# Patient Record
Sex: Male | Born: 1965
Health system: Southern US, Community
[De-identification: ages and names within clinical notes are randomized; demographics above are authoritative.]

## PROBLEM LIST (undated history)

## (undated) DIAGNOSIS — Z9889 Other specified postprocedural states: Secondary | ICD-10-CM

## (undated) DIAGNOSIS — I10 Essential (primary) hypertension: Secondary | ICD-10-CM

## (undated) HISTORY — PX: NASAL SINUS SURGERY: SHX719

## (undated) HISTORY — DX: Other specified postprocedural states: Z98.890

---

## 2005-12-12 ENCOUNTER — Other Ambulatory Visit: Payer: Self-pay

## 2005-12-12 ENCOUNTER — Emergency Department: Payer: Self-pay | Admitting: Unknown Physician Specialty

## 2008-01-01 ENCOUNTER — Other Ambulatory Visit: Payer: Self-pay

## 2008-01-01 ENCOUNTER — Emergency Department: Payer: Self-pay | Admitting: Internal Medicine

## 2008-01-04 ENCOUNTER — Ambulatory Visit: Payer: Self-pay | Admitting: Internal Medicine

## 2012-07-19 ENCOUNTER — Ambulatory Visit: Payer: Self-pay | Admitting: Family Medicine

## 2013-10-18 ENCOUNTER — Ambulatory Visit: Payer: Self-pay | Admitting: Family Medicine

## 2014-10-08 LAB — BASIC METABOLIC PANEL
BUN: 11 mg/dL (ref 4–21)
Creatinine: 0.9 mg/dL (ref <=1.3)
Glucose: 105 mg/dL

## 2014-10-08 LAB — LIPID PANEL
Cholesterol: 211 mg/dL — AB (ref 0–200)
HDL: 38 mg/dL (ref 35–70)
LDL CALC: 145 mg/dL
TRIGLYCERIDES: 139 mg/dL (ref 40–160)

## 2014-10-08 LAB — FECAL OCCULT BLOOD, GUAIAC: Fecal Occult Blood: NEGATIVE

## 2014-10-08 LAB — PSA: PSA: 0.4

## 2014-10-26 DIAGNOSIS — Z0000 Encounter for general adult medical examination without abnormal findings: Secondary | ICD-10-CM | POA: Insufficient documentation

## 2014-10-26 DIAGNOSIS — I10 Essential (primary) hypertension: Secondary | ICD-10-CM | POA: Insufficient documentation

## 2014-10-26 DIAGNOSIS — M5440 Lumbago with sciatica, unspecified side: Secondary | ICD-10-CM | POA: Insufficient documentation

## 2014-10-26 DIAGNOSIS — F339 Major depressive disorder, recurrent, unspecified: Secondary | ICD-10-CM | POA: Insufficient documentation

## 2014-10-26 DIAGNOSIS — E66812 Obesity, class 2: Secondary | ICD-10-CM | POA: Insufficient documentation

## 2014-10-26 DIAGNOSIS — IMO0002 Reserved for concepts with insufficient information to code with codable children: Secondary | ICD-10-CM | POA: Insufficient documentation

## 2014-10-26 DIAGNOSIS — J3081 Allergic rhinitis due to animal (cat) (dog) hair and dander: Secondary | ICD-10-CM | POA: Insufficient documentation

## 2015-06-08 ENCOUNTER — Other Ambulatory Visit: Payer: Self-pay | Admitting: Family Medicine

## 2015-12-04 ENCOUNTER — Other Ambulatory Visit: Payer: Self-pay | Admitting: Family Medicine

## 2015-12-16 ENCOUNTER — Other Ambulatory Visit: Payer: Self-pay

## 2015-12-19 ENCOUNTER — Encounter: Payer: Self-pay | Admitting: Family Medicine

## 2015-12-19 ENCOUNTER — Ambulatory Visit (INDEPENDENT_AMBULATORY_CARE_PROVIDER_SITE_OTHER): Payer: 59 | Admitting: Family Medicine

## 2015-12-19 VITALS — BP 120/100 | HR 80 | Resp 12 | Ht 72.0 in | Wt 248.0 lb

## 2015-12-19 DIAGNOSIS — K625 Hemorrhage of anus and rectum: Secondary | ICD-10-CM

## 2015-12-19 DIAGNOSIS — J301 Allergic rhinitis due to pollen: Secondary | ICD-10-CM

## 2015-12-19 DIAGNOSIS — I10 Essential (primary) hypertension: Secondary | ICD-10-CM

## 2015-12-19 DIAGNOSIS — Z13220 Encounter for screening for lipoid disorders: Secondary | ICD-10-CM

## 2015-12-19 LAB — HEMOCCULT GUIAC POC 1CARD (OFFICE): Fecal Occult Blood, POC: POSITIVE — AB

## 2015-12-19 MED ORDER — LORATADINE 10 MG PO TABS: 10.0000 mg | ORAL_TABLET | Freq: Every day | ORAL | Status: DC

## 2015-12-19 MED ORDER — FLUTICASONE PROPIONATE 50 MCG/ACT NA SUSP: 1.0000 | Freq: Every day | NASAL | Status: DC

## 2015-12-19 MED ORDER — LOSARTAN POTASSIUM 100 MG PO TABS: 100.0000 mg | ORAL_TABLET | Freq: Every day | ORAL | Status: DC

## 2015-12-19 NOTE — Addendum Note (Signed)
Addended by: Everitt AmberLYNCH, Nahjae Hoeg L on: 12/19/2015 11:36 AM   Modules accepted: Orders

## 2015-12-19 NOTE — Progress Notes (Signed)
Name: Erik Valdez   MRN:Rudene Re [id number]    DOB: 01/18/1966   Date:12/19/2015       Progress Note  Subjective  Chief Complaint  Chief Complaint  Patient presents with  . Allergic Rhinitis   . Hypertension    Hypertension This is a chronic problem. The current episode started more than 1 year ago. The problem has been waxing and waning since onset. The problem is uncontrolled. Pertinent negatives include no anxiety, blurred vision, chest pain, headaches, malaise/fatigue, neck pain, orthopnea, palpitations, peripheral edema, PND, shortness of breath or sweats. There are no associated agents to hypertension. There are no known risk factors for coronary artery disease. Past treatments include angiotensin blockers. The current treatment provides no improvement. There are no compliance problems (occ med missed).  There is no history of angina, kidney disease, CAD/MI, CVA, heart failure, left ventricular hypertrophy, PVD, renovascular disease or retinopathy. There is no history of chronic renal disease or a hypertension causing med.  GI Problem The primary symptoms include hematochezia. Primary symptoms do not include fever, weight loss, fatigue, abdominal pain, nausea, diarrhea, melena, hematemesis, dysuria, myalgias or rash. The illness began more than 7 days ago. The onset was gradual (BRBPR).  The illness is also significant for itching. The illness does not include chills, constipation or back pain. Associated medical issues do not include inflammatory bowel disease, GERD, gallstones, liver disease, alcohol abuse, PUD, gastric bypass, bowel resection, irritable bowel syndrome, hemorrhoids or diverticulitis.    No problem-specific assessment & plan notes found for this encounter.   No past medical history on file.  Past Surgical History  Procedure Laterality Date  . Nasal sinus surgery      No family history on file.  Social History   Social History  . Marital Status: Married    Spouse  Name: N/A  . Number of Children: N/A  . Years of Education: N/A   Occupational History  . Not on file.   Social History Main Topics  . Smoking status: Former Games developermoker  . Smokeless tobacco: Current User    Types: Chew  . Alcohol Use: 0.0 oz/week    0 Standard drinks or equivalent per week  . Drug Use: No  . Sexual Activity: Not on file   Other Topics Concern  . Not on file   Social History Narrative    No Known Allergies   Review of Systems  Constitutional: Negative for fever, chills, weight loss, malaise/fatigue and fatigue.  HENT: Negative for ear discharge, ear pain and sore throat.   Eyes: Negative for blurred vision.  Respiratory: Negative for cough, sputum production, shortness of breath and wheezing.   Cardiovascular: Negative for chest pain, palpitations, orthopnea, leg swelling and PND.  Gastrointestinal: Positive for hematochezia. Negative for heartburn, nausea, abdominal pain, diarrhea, constipation, blood in stool, melena and hematemesis.  Genitourinary: Negative for dysuria, urgency, frequency and hematuria.  Musculoskeletal: Negative for myalgias, back pain, joint pain and neck pain.  Skin: Positive for itching. Negative for rash.  Neurological: Negative for dizziness, tingling, sensory change, focal weakness and headaches.  Endo/Heme/Allergies: Negative for environmental allergies and polydipsia. Does not bruise/bleed easily.  Psychiatric/Behavioral: Negative for depression and suicidal ideas. The patient is not nervous/anxious and does not have insomnia.      Objective  Filed Vitals:   12/19/15 0913  BP: 120/100  Pulse: 80  Resp: 12  Height: 6' (1.829 m)  Weight: 248 lb (112.492 kg)    Physical Exam  Constitutional: He is oriented  to person, place, and time and well-developed, well-nourished, and in no distress.  HENT:  Head: Normocephalic.  Right Ear: External ear normal.  Left Ear: External ear normal.  Nose: Nose normal.  Mouth/Throat:  Oropharynx is clear and moist.  Eyes: Conjunctivae and EOM are normal. Pupils are equal, round, and reactive to light. Right eye exhibits no discharge. Left eye exhibits no discharge. No scleral icterus.  Neck: Normal range of motion. Neck supple. No JVD present. No tracheal deviation present. No thyromegaly present.  Cardiovascular: Normal rate, regular rhythm, normal heart sounds and intact distal pulses.  Exam reveals no gallop and no friction rub.   No murmur heard. Pulmonary/Chest: Breath sounds normal. No respiratory distress. He has no wheezes. He has no rales.  Abdominal: Soft. Bowel sounds are normal. He exhibits no mass. There is no hepatosplenomegaly. There is no tenderness. There is no rebound, no guarding and no CVA tenderness.  Genitourinary: Prostate normal. Rectal exam shows no external hemorrhoid, no internal hemorrhoid, no fissure, no laceration, no mass and no tenderness. Guaiac positive stool.  Musculoskeletal: Normal range of motion. He exhibits no edema or tenderness.  Lymphadenopathy:    He has no cervical adenopathy.  Neurological: He is alert and oriented to person, place, and time. He has normal sensation, normal strength and intact cranial nerves. No cranial nerve deficit.  Skin: Skin is warm. No rash noted.  Psychiatric: Mood and affect normal.  Nursing note and vitals reviewed.     Assessment & Plan  Problem List Items Addressed This Visit      Cardiovascular and Mediastinum   Essential (primary) hypertension - Primary   Relevant Medications   losartan (COZAAR) 100 MG tablet   Other Relevant Orders   Basic metabolic panel    Other Visit Diagnoses    Allergic rhinitis due to pollen        Relevant Medications    fluticasone (FLONASE) 50 MCG/ACT nasal spray    loratadine (CLARITIN) 10 MG tablet    Bright red rectal bleeding        Relevant Orders    POCT Occult Blood Stool (Completed)    Ambulatory referral to Gastroenterology    Screening cholesterol  level        Relevant Orders    Lipid Profile         Dr. Elizabeth Sauereanna [facility]relle [facility]  12/19/2015

## 2015-12-20 ENCOUNTER — Other Ambulatory Visit: Payer: Self-pay | Admitting: Family Medicine

## 2015-12-20 LAB — BASIC METABOLIC PANEL
BUN / CREAT RATIO: 13 (ref 9–20)
BUN: 11 mg/dL (ref 6–24)
CALCIUM: 9.4 mg/dL (ref 8.7–10.2)
CHLORIDE: 101 mmol/L (ref 96–106)
CO2: 23 mmol/L (ref 18–29)
Creatinine, Ser: 0.88 mg/dL (ref 0.76–1.27)
GFR calc non Af Amer: 102 mL/min/{1.73_m2} (ref >59)
GFR, EST AFRICAN AMERICAN: 117 mL/min/{1.73_m2} (ref >59)
GLUCOSE: 95 mg/dL (ref 65–99)
POTASSIUM: 4.7 mmol/L (ref 3.5–5.2)
Sodium: 141 mmol/L (ref 134–144)

## 2015-12-20 LAB — LIPID PANEL
CHOLESTEROL TOTAL: 178 mg/dL (ref 100–199)
Chol/HDL Ratio: 4.6 ratio units (ref 0.0–5.0)
HDL: 39 mg/dL — AB (ref >39)
LDL Calculated: 106 mg/dL — ABNORMAL HIGH (ref 0–99)
Triglycerides: 163 mg/dL — ABNORMAL HIGH (ref 0–149)
VLDL Cholesterol Cal: 33 mg/dL (ref 5–40)

## 2015-12-20 MED ORDER — HYDROCORTISONE 2.5 % RE CREA: 1.0000 "application " | TOPICAL_CREAM | Freq: Two times a day (BID) | RECTAL | Status: DC

## 2015-12-20 NOTE — Addendum Note (Signed)
Addended by: Everitt AmberLYNCH, Francesco Provencal L on: 12/20/2015 10:43 AM   Modules accepted: Orders

## 2015-12-21 DIAGNOSIS — Z9889 Other specified postprocedural states: Secondary | ICD-10-CM

## 2015-12-21 HISTORY — DX: Other specified postprocedural states: Z98.890

## 2015-12-25 ENCOUNTER — Other Ambulatory Visit: Payer: Self-pay

## 2016-01-07 ENCOUNTER — Telehealth: Payer: Self-pay

## 2016-01-07 ENCOUNTER — Other Ambulatory Visit: Payer: Self-pay

## 2016-01-07 MED ORDER — NA SULFATE-K SULFATE-MG SULF 17.5-3.13-1.6 GM/177ML PO SOLN: 1.0000 | Freq: Once | ORAL | Status: DC

## 2016-01-07 NOTE — Telephone Encounter (Signed)
Gastroenterology Pre-Procedure Review  Request Date: 01/16/2016 Requesting Physician: Dr. Yetta BarreJones  PATIENT REVIEW QUESTIONS: The patient responded to the following health history questions as indicated:    1. Are you having any GI issues? no 2. Do you have a personal history of Polyps? no 3. Do you have a family history of Colon Cancer or Polyps? no 4. Diabetes Mellitus? no 5. Joint replacements in the past 12 months?no 6. Major health problems in the past 3 months?no 7. Any artificial heart valves, MVP, or defibrillator?no    MEDICATIONS & ALLERGIES:    Patient reports the following regarding taking any anticoagulation/antiplatelet therapy:   Plavix, Coumadin, Eliquis, Xarelto, Lovenox, Pradaxa, Brilinta, or Effient? no Aspirin? no  Patient confirms/reports the following medications:  Current Outpatient Prescriptions  Medication Sig Dispense Refill  . fluticasone (FLONASE) 50 MCG/ACT nasal spray Place 1 spray into both nostrils daily. 16 g 11  . loratadine (CLARITIN) 10 MG tablet Take 1 tablet (10 mg total) by mouth daily. 30 tablet 11  . losartan (COZAAR) 100 MG tablet Take 1 tablet (100 mg total) by mouth daily. 10 tablet 0  . hydrocortisone (ANUSOL-HC) 2.5 % rectal cream Place 1 application rectally 2 (two) times daily. (Patient not taking: Reported on 01/07/2016) 30 g 0   No current facility-administered medications for this visit.    Patient confirms/reports the following allergies:  No Known Allergies  No orders of the defined types were placed in this encounter.    AUTHORIZATION INFORMATION Primary Insurance: 1D#: Group #:  Secondary Insurance: 1D#: Group #:  SCHEDULE INFORMATION: Date: 01/16/2016 Time: Location: [facility]

## 2016-01-10 ENCOUNTER — Encounter: Payer: Self-pay | Admitting: *Deleted

## 2016-01-13 NOTE — Discharge Instructions (Signed)

## 2016-01-16 ENCOUNTER — Ambulatory Visit: Payer: 59 | Admitting: Anesthesiology

## 2016-01-16 ENCOUNTER — Ambulatory Visit
Admission: RE | Admit: 2016-01-16 | Discharge: 2016-01-16 | Disposition: A | Discharge status: Home / Self Care | Payer: 59 | Source: Ambulatory Visit | Attending: Gastroenterology | Admitting: Gastroenterology

## 2016-01-16 ENCOUNTER — Encounter
Admission: RE | Disposition: A | Discharge status: Home / Self Care | Payer: Self-pay | Source: Ambulatory Visit | Attending: Gastroenterology

## 2016-01-16 DIAGNOSIS — K641 Second degree hemorrhoids: Secondary | ICD-10-CM | POA: Diagnosis not present

## 2016-01-16 DIAGNOSIS — I10 Essential (primary) hypertension: Secondary | ICD-10-CM | POA: Insufficient documentation

## 2016-01-16 DIAGNOSIS — Z9889 Other specified postprocedural states: Secondary | ICD-10-CM | POA: Insufficient documentation

## 2016-01-16 DIAGNOSIS — K921 Melena: Secondary | ICD-10-CM

## 2016-01-16 DIAGNOSIS — Z79899 Other long term (current) drug therapy: Secondary | ICD-10-CM | POA: Diagnosis not present

## 2016-01-16 DIAGNOSIS — Z7951 Long term (current) use of inhaled steroids: Secondary | ICD-10-CM | POA: Insufficient documentation

## 2016-01-16 DIAGNOSIS — F17220 Nicotine dependence, chewing tobacco, uncomplicated: Secondary | ICD-10-CM | POA: Insufficient documentation

## 2016-01-16 HISTORY — PX: COLONOSCOPY WITH PROPOFOL: SHX5780

## 2016-01-16 HISTORY — DX: Essential (primary) hypertension: I10

## 2016-01-16 SURGERY — COLONOSCOPY WITH PROPOFOL
Anesthesia: Monitor Anesthesia Care | Wound class: Contaminated

## 2016-01-16 MED ORDER — LACTATED RINGERS IV SOLN
INTRAVENOUS | Status: DC
  Administered 2016-01-16: 08:00:00 via INTRAVENOUS

## 2016-01-16 MED ORDER — LACTATED RINGERS IV SOLN: 500.0000 mL | INTRAVENOUS | Status: DC

## 2016-01-16 MED ORDER — PROPOFOL 10 MG/ML IV BOLUS
INTRAVENOUS | Status: DC | PRN
  Administered 2016-01-16: 50 mg via INTRAVENOUS
  Administered 2016-01-16: 150 mg via INTRAVENOUS
  Administered 2016-01-16: 30 mg via INTRAVENOUS
  Administered 2016-01-16: 50 mg via INTRAVENOUS

## 2016-01-16 MED ORDER — LIDOCAINE HCL (CARDIAC) 20 MG/ML IV SOLN
INTRAVENOUS | Status: DC | PRN
  Administered 2016-01-16: 50 mg via INTRAVENOUS

## 2016-01-16 MED ORDER — STERILE WATER FOR IRRIGATION IR SOLN
Status: DC | PRN
  Administered 2016-01-16: 10:00:00

## 2016-01-16 SURGICAL SUPPLY — 23 items
CANISTER SUCT 1200ML W/VALVE (MISCELLANEOUS) ×3 IMPLANT
CLIP HMST 235XBRD CATH ROT (MISCELLANEOUS) IMPLANT
CLIP RESOLUTION 360 11X235 (MISCELLANEOUS)
FCP ESCP3.2XJMB 240X2.8X (MISCELLANEOUS)
FORCEPS BIOP RAD 4 LRG CAP 4 (CUTTING FORCEPS) IMPLANT
FORCEPS BIOP RJ4 240 W/NDL (MISCELLANEOUS)
FORCEPS ESCP3.2XJMB 240X2.8X (MISCELLANEOUS) IMPLANT
GOWN CVR UNV OPN BCK APRN NK (MISCELLANEOUS) ×2 IMPLANT
GOWN ISOL THUMB LOOP REG UNIV (MISCELLANEOUS) ×4
INJECTOR VARIJECT VIN23 (MISCELLANEOUS) IMPLANT
KIT DEFENDO VALVE AND CONN (KITS) IMPLANT
KIT ENDO PROCEDURE OLY (KITS) ×3 IMPLANT
MARKER SPOT ENDO TATTOO 5ML (MISCELLANEOUS) IMPLANT
PAD GROUND ADULT SPLIT (MISCELLANEOUS) IMPLANT
PROBE APC STR FIRE (PROBE) IMPLANT
RETRIEVER NET ROTH 2.5X230 LF (MISCELLANEOUS) ×3 IMPLANT
SNARE SHORT THROW 13M SML OVAL (MISCELLANEOUS) IMPLANT
SNARE SHORT THROW 30M LRG OVAL (MISCELLANEOUS) IMPLANT
SNARE SNG USE RND 15MM (INSTRUMENTS) IMPLANT
SPOT EX ENDOSCOPIC TATTOO (MISCELLANEOUS)
TRAP ETRAP POLY (MISCELLANEOUS) IMPLANT
VARIJECT INJECTOR VIN23 (MISCELLANEOUS)
WATER STERILE IRR 250ML POUR (IV SOLUTION) ×3 IMPLANT

## 2016-01-16 NOTE — Op Note (Signed)
[facility] Gastroenterology Patient Name: Erik Valdez Procedure Date: 01/16/2016 9:38 AM MRN: [id number] Account #: [id number] Date of Birth: [birth year] Admit Type: Outpatient Age: 50 Room: [facility] OR ROOM 01 Gender: Male Note Status: Finalized Procedure:            Colonoscopy Indications:          Hematochezia Providers:            Midge Minium MD, MD Referring MD:         Duanne Limerick, MD (Referring MD) Medicines:            Propofol per Anesthesia Complications:        No immediate complications. Procedure:            Pre-Anesthesia Assessment:                       - Prior to the procedure, a History and Physical was                        performed, and patient medications and allergies were                        reviewed. The patient's tolerance of previous                        anesthesia was also reviewed. The risks and benefits of                        the procedure and the sedation options and risks were                        discussed with the patient. All questions were                        answered, and informed consent was obtained. Prior                        Anticoagulants: The patient has taken no previous                        anticoagulant or antiplatelet agents. ASA Grade                        Assessment: II - A patient with mild systemic disease.                        After reviewing the risks and benefits, the patient was                        deemed in satisfactory condition to undergo the                        procedure.                       After obtaining informed consent, the colonoscope was                        passed under direct vision. Throughout the procedure,  the patient's blood pressure, pulse, and oxygen                        saturations were monitored continuously. The Olympus CF                        H180AL colonoscope (S#: [id number]) was introduced through                        the  anus and advanced to the the cecum, identified by                        appendiceal orifice and ileocecal valve. The                        colonoscopy was performed without difficulty. The                        patient tolerated the procedure well. The quality of                        the bowel preparation was excellent. Findings:      The perianal and digital rectal examinations were normal.      Non-bleeding internal hemorrhoids were found during retroflexion. The       hemorrhoids were Grade II (internal hemorrhoids that prolapse but reduce       spontaneously). Impression:           - Non-bleeding internal hemorrhoids.                       - No specimens collected. Recommendation:       - High fiber diet. Procedure Code(s):    --- Professional ---                       [phone], Colonoscopy, flexible; diagnostic, including                        collection of specimen(s) by brushing or washing, when                        performed (separate procedure) Diagnosis Code(s):    --- Professional ---                       K92.1, Melena (includes Hematochezia) CPT copyright 2016 American Medical Association. All rights reserved. The codes documented in this report are preliminary and upon coder review may  be revised to meet current compliance requirements. Midge Minium MD, MD 01/16/2016 9:54:14 AM This report has been signed electronically. Number of Addenda: 0 Note Initiated On: 01/16/2016 9:38 AM Scope Withdrawal Time: 0 hours 7 minutes 21 seconds  Total Procedure Duration: 0 hours 8 minutes 58 seconds       [facility]

## 2016-01-16 NOTE — Anesthesia Procedure Notes (Signed)
Procedure Name: MAC Date/Time: 01/16/2016 9:39 AM Performed by: Maree Krabbe Pre-anesthesia Checklist: Patient identified, Emergency Drugs available, Suction available, Timeout performed and Patient being monitored Patient Re-evaluated:Patient Re-evaluated prior to inductionOxygen Delivery Method: Nasal cannula Placement Confirmation: positive ETCO2

## 2016-01-16 NOTE — Anesthesia Postprocedure Evaluation (Signed)
Anesthesia Post Note  Patient: Erik Valdez  Procedure(s) Performed: Procedure(s) (LRB): COLONOSCOPY WITH PROPOFOL (N/A)  Patient location during evaluation: PACU Anesthesia Type: MAC Level of consciousness: awake and alert Pain management: pain level controlled Vital Signs Assessment: post-procedure vital signs reviewed and stable Respiratory status: spontaneous breathing, nonlabored ventilation and respiratory function stable Cardiovascular status: stable and blood pressure returned to baseline Anesthetic complications: no    DANIEL D KOVACS

## 2016-01-16 NOTE — Transfer of Care (Signed)
Immediate Anesthesia Transfer of Care Note  Patient: Erik Valdez  Procedure(s) Performed: Procedure(s): COLONOSCOPY WITH PROPOFOL (N/A)  Patient Location: PACU  Anesthesia Type: MAC  Level of Consciousness: awake, alert  and patient cooperative  Airway and Oxygen Therapy: Patient Spontanous Breathing and Patient connected to supplemental oxygen  Post-op Assessment: Post-op Vital signs reviewed, Patient's Cardiovascular Status Stable, Respiratory Function Stable, Patent Airway and No signs of Nausea or vomiting  Post-op Vital Signs: Reviewed and stable  Complications: No apparent anesthesia complications

## 2016-01-16 NOTE — Anesthesia Preprocedure Evaluation (Signed)
Anesthesia Evaluation  Patient identified by MRN, date of birth, ID band Patient awake    Reviewed: Allergy & Precautions, H&P , NPO status , Patient's Chart, lab work & pertinent test results, reviewed documented beta blocker date and time   Airway Mallampati: III  TM Distance: >3 FB Neck ROM: full    Dental no notable dental hx.    Pulmonary neg pulmonary ROS,    Pulmonary exam normal breath sounds clear to auscultation       Cardiovascular Exercise Tolerance: Good hypertension, On Medications  Rhythm:regular Rate:Normal     Neuro/Psych negative neurological ROS  negative psych ROS   GI/Hepatic negative GI ROS, Neg liver ROS,   Endo/Other  negative endocrine ROS  Renal/GU negative Renal ROS  negative genitourinary   Musculoskeletal   Abdominal   Peds  Hematology negative hematology ROS (+)   Anesthesia Other Findings   Reproductive/Obstetrics negative OB ROS                             Anesthesia Physical Anesthesia Plan  ASA: II  Anesthesia Plan: MAC   Post-op Pain Management:    Induction:   Airway Management Planned:   Additional Equipment:   Intra-op Plan:   Post-operative Plan:   Informed Consent: I have reviewed the patients History and Physical, chart, labs and discussed the procedure including the risks, benefits and alternatives for the proposed anesthesia with the patient or authorized representative who has indicated his/her understanding and acceptance.     Plan Discussed with: CRNA  Anesthesia Plan Comments:         Anesthesia Quick Evaluation

## 2016-01-16 NOTE — H&P (Signed)
  Lucilla Lame, MD [address] [place], [place] [address] Phone: [phone] Fax : [phone]  Primary Care Physician:  Otilio Miu, MD Primary Gastroenterologist:  Dr. Allen Norris  Pre-Procedure History & Physical: HPI:  Erik Valdez is a 50 y.o. male is here for an colonoscopy.   Past Medical History:  Diagnosis Date  . Hypertension     Past Surgical History:  Procedure Laterality Date  . NASAL SINUS SURGERY     age 22 or 8    Prior to Admission medications   Medication Sig Start Date End Date Taking? Authorizing Provider  fluticasone (FLONASE) 50 MCG/ACT nasal spray Place 1 spray into both nostrils daily. 12/19/15  Yes Juline Patch, MD  loratadine (CLARITIN) 10 MG tablet Take 1 tablet (10 mg total) by mouth daily. 12/19/15  Yes Juline Patch, MD  losartan (COZAAR) 100 MG tablet Take 1 tablet (100 mg total) by mouth daily. 12/19/15  Yes Juline Patch, MD  Na Sulfate-K Sulfate-Mg Sulf (SUPREP BOWEL PREP KIT) 17.5-3.13-1.6 GM/180ML SOLN Take 1 Dose by mouth once. 01/07/16  Yes Lucilla Lame, MD  hydrocortisone (ANUSOL-HC) 2.5 % rectal cream Place 1 application rectally 2 (two) times daily. Patient not taking: Reported on 01/07/2016 12/20/15   Juline Patch, MD    Allergies as of 01/07/2016  . (No Known Allergies)    History reviewed. No pertinent family history.  Social History   Social History  . Marital status: Married    Spouse name: N/A  . Number of children: N/A  . Years of education: N/A   Occupational History  . Not on file.   Social History Main Topics  . Smoking status: Never Smoker  . Smokeless tobacco: Current User    Types: Chew     Comment: Smokeless tobacco - occasionally (golfing, etc)  . Alcohol use 3.6 oz/week    6 Cans of beer per week  . Drug use: No  . Sexual activity: Not on file   Other Topics Concern  . Not on file   Social History Narrative  . No narrative on file    Review of Systems: See HPI, otherwise negative  ROS  Physical Exam: BP 124/78   Pulse (!) 57   Temp 98 F (36.7 C) (Temporal)   Resp 16   Ht 6' (1.829 m)   Wt 238 lb (108 kg)   SpO2 97%   BMI 32.28 kg/m  General:   Alert,  pleasant and cooperative in NAD Head:  Normocephalic and atraumatic. Neck:  Supple; no masses or thyromegaly. Lungs:  Clear throughout to auscultation.    Heart:  Regular rate and rhythm. Abdomen:  Soft, nontender and nondistended. Normal bowel sounds, without guarding, and without rebound.   Neurologic:  Alert and  oriented x4;  grossly normal neurologically.  Impression/Plan: Erik Valdez is here for an colonoscopy to be performed for hematochezia  Risks, benefits, limitations, and alternatives regarding  colonoscopy have been reviewed with the patient.  Questions have been answered.  All parties agreeable.   Lucilla Lame, MD  01/16/2016, 8:20 AM

## 2016-01-17 ENCOUNTER — Encounter: Payer: Self-pay | Admitting: Gastroenterology

## 2016-02-03 ENCOUNTER — Other Ambulatory Visit: Payer: Self-pay

## 2016-03-30 ENCOUNTER — Ambulatory Visit (INDEPENDENT_AMBULATORY_CARE_PROVIDER_SITE_OTHER): Payer: 59 | Admitting: Family Medicine

## 2016-03-30 ENCOUNTER — Encounter: Payer: Self-pay | Admitting: Family Medicine

## 2016-03-30 VITALS — BP 120/92 | HR 84 | Ht 72.0 in | Wt 250.0 lb

## 2016-03-30 DIAGNOSIS — I10 Essential (primary) hypertension: Secondary | ICD-10-CM

## 2016-03-30 MED ORDER — LOSARTAN POTASSIUM 100 MG PO TABS: 100.0000 mg | ORAL_TABLET | Freq: Every day | ORAL | 1 refills | Status: DC

## 2016-03-30 MED ORDER — HYDROCHLOROTHIAZIDE 12.5 MG PO TABS
12.5000 mg | ORAL_TABLET | Freq: Every day | ORAL | 1 refills | Status: DC
Start: 2016-03-30 — End: 2016-09-28

## 2016-03-30 NOTE — Progress Notes (Signed)
Name: Erik Valdez   MRN: [id number]    DOB: 1965/07/16   Date:03/30/2016       Progress Note  Subjective  Chief Complaint  Chief Complaint  Patient presents with  . Hypertension    recheck B/P- seems like "it's not working, feel hot all the time"    Hypertension  This is a chronic problem. The current episode started more than 1 year ago. The problem has been waxing and waning since onset. The problem is uncontrolled. Pertinent negatives include no anxiety, blurred vision, chest pain, headaches, malaise/fatigue, neck pain, orthopnea, palpitations, peripheral edema, PND, shortness of breath or sweats. ("feeling hot") There are no associated agents to hypertension. Risk factors for coronary artery disease include dyslipidemia. Past treatments include ACE inhibitors. The current treatment provides mild improvement. There is no history of angina, kidney disease, CAD/MI, CVA, heart failure, left ventricular hypertrophy, PVD, renovascular disease or retinopathy. There is no history of chronic renal disease or a hypertension causing med.    No problem-specific Assessment & Plan notes found for this encounter.   Past Medical History:  Diagnosis Date  . History of colonoscopy 12/21/2015   internal hemorrhoids- cleared for 10 years- Dr Servando Snare  . Hypertension     Past Surgical History:  Procedure Laterality Date  . COLONOSCOPY WITH PROPOFOL N/A 01/16/2016   Procedure: COLONOSCOPY WITH PROPOFOL;  Surgeon: Midge Minium, MD;  Location[facility];  Service: Endoscopy;  Laterality: N/A;  . NASAL SINUS SURGERY     age 50 or 50    No family history on file.  Social History   Social History  . Marital status: Married    Spouse name: N/A  . Number of children: N/A  . Years of education: N/A   Occupational History  . Not on file.   Social History Main Topics  . Smoking status: Never Smoker  . Smokeless tobacco: Current User    Types: Chew     Comment: Smokeless tobacco -  occasionally (golfing, etc)  . Alcohol use 3.6 oz/week    6 Cans of beer per week  . Drug use: No  . Sexual activity: Not on file   Other Topics Concern  . Not on file   Social History Narrative  . No narrative on file    No Known Allergies   Review of Systems  Constitutional: Negative for chills, fever, malaise/fatigue and weight loss.  HENT: Negative for ear discharge, ear pain and sore throat.   Eyes: Negative for blurred vision.  Respiratory: Negative for cough, sputum production, shortness of breath and wheezing.   Cardiovascular: Negative for chest pain, palpitations, orthopnea, leg swelling and PND.  Gastrointestinal: Negative for abdominal pain, blood in stool, constipation, diarrhea, heartburn, melena and nausea.  Genitourinary: Negative for dysuria, frequency, hematuria and urgency.  Musculoskeletal: Negative for back pain, joint pain, myalgias and neck pain.  Skin: Negative for rash.  Neurological: Negative for dizziness, tingling, sensory change, focal weakness and headaches.  Endo/Heme/Allergies: Negative for environmental allergies and polydipsia. Does not bruise/bleed easily.  Psychiatric/Behavioral: Negative for depression and suicidal ideas. The patient is not nervous/anxious and does not have insomnia.      Objective  Vitals:   03/30/16 0922  BP: (!) 120/92  Pulse: 84  Weight: 250 lb (113.4 kg)  Height: 6' (1.829 m)    Physical Exam  Constitutional: He is oriented to person, place, and time and well-developed, well-nourished, and in no distress.  HENT:  Head: Normocephalic.  Right Ear:  External ear normal.  Left Ear: External ear normal.  Nose: Nose normal.  Mouth/Throat: Oropharynx is clear and moist.  Eyes: Conjunctivae and EOM are normal. Pupils are equal, round, and reactive to light. Right eye exhibits no discharge. Left eye exhibits no discharge. No scleral icterus.  Neck: Normal range of motion. Neck supple. No JVD present. No tracheal  deviation present. No thyromegaly present.  Cardiovascular: Normal rate, regular rhythm, normal heart sounds and intact distal pulses.  Exam reveals no gallop and no friction rub.   No murmur heard. Pulmonary/Chest: Breath sounds normal. No respiratory distress. He has no wheezes. He has no rales.  Abdominal: Soft. Bowel sounds are normal. He exhibits no mass. There is no hepatosplenomegaly. There is no tenderness. There is no rebound, no guarding and no CVA tenderness.  Musculoskeletal: Normal range of motion. He exhibits no edema or tenderness.  Lymphadenopathy:    He has no cervical adenopathy.  Neurological: He is alert and oriented to person, place, and time. He has normal sensation, normal strength, normal reflexes and intact cranial nerves. No cranial nerve deficit.  Skin: Skin is warm. No rash noted.  Psychiatric: Mood and affect normal.  Nursing note and vitals reviewed.     Assessment & Plan  Problem List Items Addressed This Visit      Cardiovascular and Mediastinum   Essential (primary) hypertension - Primary   Relevant Medications   hydrochlorothiazide (HYDRODIURIL) 12.5 MG tablet   losartan (COZAAR) 100 MG tablet    Other Visit Diagnoses   None.       Dr. Hayden Rasmusseneanna Teri [facility]  03/30/16

## 2016-05-23 ENCOUNTER — Other Ambulatory Visit: Payer: Self-pay | Admitting: Family Medicine

## 2016-05-23 DIAGNOSIS — I10 Essential (primary) hypertension: Secondary | ICD-10-CM

## 2016-06-19 ENCOUNTER — Ambulatory Visit: Payer: 59 | Admitting: Family Medicine

## 2016-06-25 ENCOUNTER — Ambulatory Visit: Payer: 59 | Admitting: Family Medicine

## 2016-07-10 ENCOUNTER — Other Ambulatory Visit: Payer: Self-pay

## 2016-09-16 ENCOUNTER — Other Ambulatory Visit: Payer: Self-pay | Admitting: Family Medicine

## 2016-09-16 DIAGNOSIS — I10 Essential (primary) hypertension: Secondary | ICD-10-CM

## 2016-09-28 ENCOUNTER — Ambulatory Visit (INDEPENDENT_AMBULATORY_CARE_PROVIDER_SITE_OTHER): Payer: 59 | Admitting: Family Medicine

## 2016-09-28 ENCOUNTER — Encounter: Payer: Self-pay | Admitting: Family Medicine

## 2016-09-28 VITALS — BP 120/88 | HR 86 | Ht 72.0 in | Wt 255.0 lb

## 2016-09-28 DIAGNOSIS — R079 Chest pain, unspecified: Secondary | ICD-10-CM | POA: Diagnosis not present

## 2016-09-28 DIAGNOSIS — E782 Mixed hyperlipidemia: Secondary | ICD-10-CM

## 2016-09-28 DIAGNOSIS — J301 Allergic rhinitis due to pollen: Secondary | ICD-10-CM | POA: Insufficient documentation

## 2016-09-28 DIAGNOSIS — K644 Residual hemorrhoidal skin tags: Secondary | ICD-10-CM | POA: Diagnosis not present

## 2016-09-28 DIAGNOSIS — I10 Essential (primary) hypertension: Secondary | ICD-10-CM | POA: Diagnosis not present

## 2016-09-28 LAB — POCT URINALYSIS DIPSTICK
BILIRUBIN UA: NEGATIVE
GLUCOSE UA: NEGATIVE
KETONES UA: NEGATIVE
Leukocytes, UA: NEGATIVE
NITRITE UA: NEGATIVE
Protein, UA: NEGATIVE
Spec Grav, UA: 1.01 (ref 1.030–1.035)
Urobilinogen, UA: 0.2 (ref ?–2.0)
pH, UA: 5 (ref 5.0–8.0)

## 2016-09-28 MED ORDER — LOSARTAN POTASSIUM 100 MG PO TABS: 100.0000 mg | ORAL_TABLET | Freq: Every day | ORAL | 2 refills | Status: DC

## 2016-09-28 MED ORDER — LORATADINE 10 MG PO TABS: 10.0000 mg | ORAL_TABLET | Freq: Every day | ORAL | 11 refills | Status: DC

## 2016-09-28 MED ORDER — HYDROCORTISONE 2.5 % RE CREA: 1.0000 "application " | TOPICAL_CREAM | Freq: Two times a day (BID) | RECTAL | 3 refills | Status: DC

## 2016-09-28 MED ORDER — FLUTICASONE PROPIONATE 50 MCG/ACT NA SUSP: 1.0000 | Freq: Every day | NASAL | 11 refills | Status: DC

## 2016-09-28 NOTE — Progress Notes (Signed)
Name: Erik Valdez   MRN: [id number]    DOB: 1965/07/27   Date:09/28/2016       Progress Note  Subjective  Chief Complaint  Chief Complaint  Patient presents with  . Annual Exam    Patient present for first onset chest pain and hypertension check.   Chest Pain   This is a new problem. The current episode started more than 1 month ago. The onset quality is gradual. The problem occurs intermittently. The problem has been waxing and waning. The pain is present in the substernal region. The pain is at a severity of 5/10. The pain is moderate. The quality of the pain is described as heavy and tightness (duration 15-20 minutes). The pain radiates to the right jaw. Associated symptoms include exertional chest pressure, headaches, leg pain and shortness of breath. Pertinent negatives include no abdominal pain, back pain, cough, diaphoresis, dizziness, fever, irregular heartbeat, malaise/fatigue, nausea, near-syncope, numbness, orthopnea, palpitations, PND or sputum production. He has tried nothing for the symptoms. Risk factors include male gender.  His past medical history is significant for hypertension.  Pertinent negatives for past medical history include no CHF, no diabetes, no DVT, no hyperlipidemia, no MI, no PE, no PVD, no strokes and no TIA.  His family medical history is significant for aortic dissection and hypertension.  Pertinent negatives for family medical history include: no CAD, no heart disease, no hyperlipidemia, no stroke and no TIA. Prior workup: 10 years ago.  Hypertension  This is a chronic problem. The current episode started more than 1 year ago. The problem has been gradually improving since onset. The problem is controlled. Associated symptoms include chest pain, headaches and shortness of breath. Pertinent negatives include no blurred vision, malaise/fatigue, neck pain, orthopnea, palpitations, peripheral edema or PND. There are no associated agents to hypertension. Risk  factors for coronary artery disease include male gender. Past treatments include ACE inhibitors. There are no compliance problems.  There is no history of angina, kidney disease, CAD/MI, CVA, heart failure, left ventricular hypertrophy, PVD or retinopathy.    No problem-specific Assessment & Plan notes found for this encounter.   Past Medical History:  Diagnosis Date  . History of colonoscopy 12/21/2015   internal hemorrhoids- cleared for 10 years- Dr Servando Snare  . Hypertension     Past Surgical History:  Procedure Laterality Date  . COLONOSCOPY WITH PROPOFOL N/A 01/16/2016   Procedure: COLONOSCOPY WITH PROPOFOL;  Surgeon: Midge Minium, MD;  Location: [facility];  Service: Endoscopy;  Laterality: N/A;  . NASAL SINUS SURGERY     age 20 or 8    History reviewed. No pertinent family history.  Social History   Social History  . Marital status: Married    Spouse name: N/A  . Number of children: N/A  . Years of education: N/A   Occupational History  . Not on file.   Social History Main Topics  . Smoking status: Never Smoker  . Smokeless tobacco: Current User    Types: Chew     Comment: Smokeless tobacco - occasionally (golfing, etc)  . Alcohol use 3.6 oz/week    6 Cans of beer per week  . Drug use: No  . Sexual activity: Not on file   Other Topics Concern  . Not on file   Social History Narrative  . No narrative on file    No Known Allergies  Outpatient Medications Prior to Visit  Medication Sig Dispense Refill  . fluticasone (FLONASE) 50 MCG/ACT nasal spray  Place 1 spray into both nostrils daily. 16 g 11  . hydrocortisone (ANUSOL-HC) 2.5 % rectal cream Place 1 application rectally 2 (two) times daily. 30 g 0  . loratadine (CLARITIN) 10 MG tablet Take 1 tablet (10 mg total) by mouth daily. 30 tablet 11  . losartan (COZAAR) 100 MG tablet TAKE 1 TABLET BY MOUTH  DAILY 90 tablet 0  . hydrochlorothiazide (HYDRODIURIL) 12.5 MG tablet Take 1 tablet (12.5 mg total) by  mouth daily. (Patient not taking: Reported on 09/28/2016) 90 tablet 1   No facility-administered medications prior to visit.     Review of Systems  Constitutional: Negative for chills, diaphoresis, fever, malaise/fatigue and weight loss.  HENT: Negative for ear discharge, ear pain and sore throat.   Eyes: Negative for blurred vision.  Respiratory: Positive for shortness of breath. Negative for cough, sputum production and wheezing.   Cardiovascular: Positive for chest pain. Negative for palpitations, orthopnea, leg swelling, PND and near-syncope.  Gastrointestinal: Negative for abdominal pain, blood in stool, constipation, diarrhea, heartburn, melena and nausea.  Genitourinary: Negative for dysuria, frequency, hematuria and urgency.  Musculoskeletal: Negative for back pain, joint pain, myalgias and neck pain.  Skin: Negative for rash.  Neurological: Positive for headaches. Negative for dizziness, tingling, sensory change, focal weakness and numbness.  Endo/Heme/Allergies: Negative for environmental allergies and polydipsia. Does not bruise/bleed easily.  Psychiatric/Behavioral: Negative for depression and suicidal ideas. The patient is not nervous/anxious and does not have insomnia.      Objective  Vitals:   09/28/16 0827  BP: 120/88  Pulse: 86  SpO2: 97%  Weight: 255 lb (115.7 kg)  Height: 6' (1.829 m)    Physical Exam  Constitutional: He is oriented to person, place, and time and well-developed, well-nourished, and in no distress.  HENT:  Head: Normocephalic.  Right Ear: External ear normal.  Left Ear: External ear normal.  Nose: Nose normal.  Mouth/Throat: Oropharynx is clear and moist.  Eyes: Conjunctivae and EOM are normal. Pupils are equal, round, and reactive to light. Right eye exhibits no discharge. Left eye exhibits no discharge. No scleral icterus.  Neck: Normal range of motion. Neck supple. No JVD present. No tracheal deviation present. No thyromegaly present.   Cardiovascular: Normal rate, regular rhythm, normal heart sounds and intact distal pulses.  Exam reveals no gallop and no friction rub.   No murmur heard. Pulmonary/Chest: Breath sounds normal. No respiratory distress. He has no wheezes. He has no rales.  Abdominal: Soft. Bowel sounds are normal. He exhibits no mass. There is no hepatosplenomegaly. There is no tenderness. There is no rebound, no guarding and no CVA tenderness.  Musculoskeletal: Normal range of motion. He exhibits no edema or tenderness.  Lymphadenopathy:    He has no cervical adenopathy.  Neurological: He is alert and oriented to person, place, and time. He has normal sensation, normal strength and intact cranial nerves. No cranial nerve deficit.  Skin: Skin is warm. No rash noted.  Psychiatric: Mood and affect normal.  Nursing note and vitals reviewed.     Assessment & Plan  Problem List Items Addressed This Visit      Cardiovascular and Mediastinum   Essential hypertension   Relevant Medications   losartan (COZAAR) 100 MG tablet   Other Relevant Orders   POCT Urinalysis Dipstick (Completed)   Renal Function Panel   External hemorrhoid   Relevant Medications   losartan (COZAAR) 100 MG tablet   hydrocortisone (ANUSOL-HC) 2.5 % rectal cream     Respiratory  Chronic seasonal allergic rhinitis due to pollen   Relevant Medications   fluticasone (FLONASE) 50 MCG/ACT nasal spray   loratadine (CLARITIN) 10 MG tablet     Other   Mixed hyperlipidemia   Relevant Medications   losartan (COZAAR) 100 MG tablet   Other Relevant Orders   Lipid Profile    Other Visit Diagnoses    Chest pain, unspecified type    -  Primary   Relevant Medications   losartan (COZAAR) 100 MG tablet   Other Relevant Orders   EKG 12-Lead (Completed)   Ambulatory referral to Cardiology      Meds ordered this encounter  Medications  . losartan (COZAAR) 100 MG tablet    Sig: Take 1 tablet (100 mg total) by mouth daily.     Dispense:  90 tablet    Refill:  2  . hydrocortisone (ANUSOL-HC) 2.5 % rectal cream    Sig: Place 1 application rectally 2 (two) times daily.    Dispense:  30 g    Refill:  3  . fluticasone (FLONASE) 50 MCG/ACT nasal spray    Sig: Place 1 spray into both nostrils daily.    Dispense:  16 g    Refill:  11  . loratadine (CLARITIN) 10 MG tablet    Sig: Take 1 tablet (10 mg total) by mouth daily.    Dispense:  30 tablet    Refill:  11      Dr. [facility]  09/28/16

## 2016-09-29 LAB — RENAL FUNCTION PANEL
Albumin: 4.4 g/dL (ref 3.5–5.5)
BUN/Creatinine Ratio: 14 (ref 9–20)
BUN: 13 mg/dL (ref 6–24)
CALCIUM: 9.3 mg/dL (ref 8.7–10.2)
CO2: 23 mmol/L (ref 18–29)
CREATININE: 0.91 mg/dL (ref 0.76–1.27)
Chloride: 101 mmol/L (ref 96–106)
GFR calc Af Amer: 114 mL/min/{1.73_m2} (ref >59)
GFR, EST NON AFRICAN AMERICAN: 99 mL/min/{1.73_m2} (ref >59)
Glucose: 91 mg/dL (ref 65–99)
PHOSPHORUS: 3 mg/dL (ref 2.5–4.5)
Potassium: 4.5 mmol/L (ref 3.5–5.2)
SODIUM: 139 mmol/L (ref 134–144)

## 2016-09-29 LAB — LIPID PANEL
CHOL/HDL RATIO: 4.5 ratio (ref 0.0–5.0)
Cholesterol, Total: 208 mg/dL — ABNORMAL HIGH (ref 100–199)
HDL: 46 mg/dL (ref >39)
LDL CALC: 127 mg/dL — AB (ref 0–99)
TRIGLYCERIDES: 176 mg/dL — AB (ref 0–149)
VLDL Cholesterol Cal: 35 mg/dL (ref 5–40)

## 2016-10-01 DIAGNOSIS — I208 Other forms of angina pectoris: Secondary | ICD-10-CM | POA: Insufficient documentation

## 2016-10-01 DIAGNOSIS — I2089 Other forms of angina pectoris: Secondary | ICD-10-CM | POA: Insufficient documentation

## 2016-10-01 HISTORY — DX: Other forms of angina pectoris: I20.89

## 2016-10-23 ENCOUNTER — Encounter: Payer: Self-pay | Admitting: Family Medicine

## 2016-10-23 ENCOUNTER — Ambulatory Visit (INDEPENDENT_AMBULATORY_CARE_PROVIDER_SITE_OTHER): Payer: 59 | Admitting: Family Medicine

## 2016-10-23 VITALS — BP 130/80 | HR 80 | Ht 72.0 in | Wt 256.0 lb

## 2016-10-23 DIAGNOSIS — S46811A Strain of other muscles, fascia and tendons at shoulder and upper arm level, right arm, initial encounter: Secondary | ICD-10-CM | POA: Diagnosis not present

## 2016-10-23 MED ORDER — TRAMADOL HCL 50 MG PO TABS: 50.0000 mg | ORAL_TABLET | Freq: Three times a day (TID) | ORAL | 0 refills | Status: DC | PRN

## 2016-10-23 MED ORDER — CYCLOBENZAPRINE HCL 10 MG PO TABS: 10.0000 mg | ORAL_TABLET | Freq: Three times a day (TID) | ORAL | 0 refills | Status: DC | PRN

## 2016-10-23 NOTE — Progress Notes (Signed)
Name: Erik Valdez   MRN: [id number]    DOB: [birth year]   Date:10/23/2016       Progress Note  Subjective  Chief Complaint  Chief Complaint  Patient presents with  . Back Pain    "tweeked muscle in back" Picked up a gas can and reached over fence with it- felt back pull- usually gets Flexeril for muscle spasms    Back Pain  This is a new problem. The current episode started yesterday. The problem occurs intermittently. The problem has been gradually worsening since onset. The pain is present in the thoracic spine. The quality of the pain is described as aching and burning. The pain is at a severity of 7/10. The pain is moderate. Exacerbated by: reaching. Pertinent negatives include no abdominal pain, chest pain, dysuria, fever, headaches, numbness, paresis, tingling or weight loss. He has tried analgesics and NSAIDs for the symptoms. The treatment provided moderate relief.    No problem-specific Assessment & Plan notes found for this encounter.   Past Medical History:  Diagnosis Date  . History of colonoscopy 12/21/2015   internal hemorrhoids- cleared for 10 years- Dr Servando Snare  . Hypertension     Past Surgical History:  Procedure Laterality Date  . COLONOSCOPY WITH PROPOFOL N/A 01/16/2016   Procedure: COLONOSCOPY WITH PROPOFOL;  Surgeon: Midge Minium, MD;  Location: [facility];  Service: Endoscopy;  Laterality: N/A;  . NASAL SINUS SURGERY     age 51 or 51    No family history on file.  Social History   Social History  . Marital status: Married    Spouse name: N/A  . Number of children: N/A  . Years of education: N/A   Occupational History  . Not on file.   Social History Main Topics  . Smoking status: Never Smoker  . Smokeless tobacco: Current User    Types: Chew     Comment: Smokeless tobacco - occasionally (golfing, etc)  . Alcohol use 3.6 oz/week    6 Cans of beer per week  . Drug use: No  . Sexual activity: Not on file   Other Topics Concern  . Not on  file   Social History Narrative  . No narrative on file    No Known Allergies  Outpatient Medications Prior to Visit  Medication Sig Dispense Refill  . fluticasone (FLONASE) 50 MCG/ACT nasal spray Place 1 spray into both nostrils daily. 16 g 11  . hydrocortisone (ANUSOL-HC) 2.5 % rectal cream Place 1 application rectally 2 (two) times daily. 30 g 3  . loratadine (CLARITIN) 10 MG tablet Take 1 tablet (10 mg total) by mouth daily. 30 tablet 11  . losartan (COZAAR) 100 MG tablet Take 1 tablet (100 mg total) by mouth daily. 90 tablet 2   No facility-administered medications prior to visit.     Review of Systems  Constitutional: Negative for chills, fever, malaise/fatigue and weight loss.  HENT: Negative for ear discharge, ear pain and sore throat.   Eyes: Negative for blurred vision.  Respiratory: Negative for cough, sputum production, shortness of breath and wheezing.   Cardiovascular: Negative for chest pain, palpitations and leg swelling.  Gastrointestinal: Negative for abdominal pain, blood in stool, constipation, diarrhea, heartburn, melena and nausea.  Genitourinary: Negative for dysuria, frequency, hematuria and urgency.  Musculoskeletal: Positive for back pain. Negative for joint pain, myalgias and neck pain.  Skin: Negative for rash.  Neurological: Negative for dizziness, tingling, sensory change, focal weakness, numbness and headaches.  Endo/Heme/Allergies: Negative  for environmental allergies and polydipsia. Does not bruise/bleed easily.  Psychiatric/Behavioral: Negative for depression and suicidal ideas. The patient is not nervous/anxious and does not have insomnia.      Objective  Vitals:   10/23/16 1111  BP: 130/80  Pulse: 80  Weight: 256 lb (116.1 kg)  Height: 6' (1.829 m)    Physical Exam  Constitutional: He is oriented to person, place, and time and well-developed, well-nourished, and in no distress.  HENT:  Head: Normocephalic.  Right Ear: External ear  normal.  Left Ear: External ear normal.  Nose: Nose normal.  Mouth/Throat: Oropharynx is clear and moist.  Eyes: Conjunctivae and EOM are normal. Pupils are equal, round, and reactive to light. Right eye exhibits no discharge. Left eye exhibits no discharge. No scleral icterus.  Neck: Normal range of motion. Neck supple. No JVD present. No tracheal deviation present. No thyromegaly present.  Cardiovascular: Normal rate, regular rhythm, normal heart sounds and intact distal pulses.  Exam reveals no gallop and no friction rub.   No murmur heard. Pulmonary/Chest: Breath sounds normal. No respiratory distress. He has no wheezes. He has no rales.  Abdominal: Soft. Bowel sounds are normal. He exhibits no mass. There is no hepatosplenomegaly. There is no tenderness. There is no rebound, no guarding and no CVA tenderness.  Musculoskeletal: Normal range of motion. He exhibits no edema or tenderness.  Lymphadenopathy:    He has no cervical adenopathy.  Neurological: He is alert and oriented to person, place, and time. He has normal sensation, normal strength, normal reflexes and intact cranial nerves. No cranial nerve deficit.  Skin: Skin is warm. No rash noted.  Psychiatric: Mood and affect normal.      Assessment & Plan  Problem List Items Addressed This Visit    None    Visit Diagnoses    Strain of right subscapularis muscle, initial encounter    -  Primary   Relevant Medications   cyclobenzaprine (FLEXERIL) 10 MG tablet   traMADol (ULTRAM) 50 MG tablet      Meds ordered this encounter  Medications  . cyclobenzaprine (FLEXERIL) 10 MG tablet    Sig: Take 1 tablet (10 mg total) by mouth 3 (three) times daily as needed for muscle spasms.    Dispense:  30 tablet    Refill:  0  . traMADol (ULTRAM) 50 MG tablet    Sig: Take 1 tablet (50 mg total) by mouth every 8 (eight) hours as needed.    Dispense:  30 tablet    Refill:  0      Dr. Hayden Rasmusseneanna Jones [facility]  10/23/16

## 2016-10-23 NOTE — Patient Instructions (Signed)
Subscapularis Tendon Injury A subscapularis tendon injury is a tear in the strong cord of tissue (tendon) that connects one of the shoulder muscles (subscapularis muscle) to the top of the upper arm bone (humerus). The subscapularis muscle starts at the inside of the shoulder blade (scapula) and attaches to the humerus. The subscapularis muscle is one of four muscles that make up the rotator cuff in the shoulder. It helps to rotate the shoulder inward and stabilize the shoulder. Subscapularis tears can be partial or complete tears. These injuries cause shoulder pain and weakness, and, in severe cases, shoulder instability. Subscapularis tears may also involve tearing of a different muscle in the rotator cuff (supraspinatus muscle), injury to the biceps tendon, or both. What are the causes? Subscapularis tears are commonly caused by gradual wear and tear from overuse. Gradual wear and tear can result from:  Participating in sports or activities that involve overhead arm movements.  Aging. Subscapularis tears can also be caused by a sudden (acute) injury, which can result from:  Falling on an outstretched arm.  Forcefully pulling or thrusting your arm upward, especially with unexpected resistance.  The shoulder joint moving out of place (dislocation). What increases the risk? This condition may be more likely to develop in:  Men, especially men who are age 49 and older.  Baseball players, especially pitchers.  Tennis players.  Rowers.  Weight lifters.  Painters and carpenters. What are the signs or symptoms? Shoulder pain is the main symptom of this condition. If your condition is caused by an acute injury, pain may be sudden and severe. If your condition is caused by overuse, pain may get worse with activity and get better with rest. Other signs and symptoms may include:  Pain with shoulder movement.  Pain at night when lying on your arm or when your arm is  unsupported.  Stiffness and limited range of motion.  Weakness. How is this diagnosed? This condition is diagnosed based on:  Your symptoms.  Your medical history.  A physical exam. Your health care provider may check your shoulder strength and range of motion. This may include having you put your hand behind your back and try to hold your hand away from your back (lift-off test).  Imaging tests, such as MRI or CT scan. How is this treated? Treatment for this condition may include:  Avoiding activities that cause shoulder pain.  NSAIDs to help reduce pain and swelling.  Physical therapy to improve your strength and range of motion.  One or more shots (injections) of medicines to numb the area and reduce inflammation (steroids).  Surgery to repair the tendon. This may be done if:  Nonsurgical treatments have not helped after 6 weeks.  You have had a recent acute injury.  You have a large tear.  You have a lot of shoulder weakness.  You are an active person or athlete who needs complete shoulder function. After surgery, treatment includes keeping your arm still while it heals (immobilization) and physical therapy. Follow these instructions at home: Managing pain, stiffness, and swelling    If directed, put ice on the injured area.  Put ice in a plastic bag.  Place a towel between your skin and the bag.  Leave the ice on for 20 minutes, 2-3 times a day. Driving   Ask your health care provider when it is safe for you to drive.  Do not drive or operate heavy machinery while taking prescription pain medicine. Activity   Return to your normal  activities as told by your health care provider. Ask your health care provider what activities are safe for you.  Do exercises as told by your health care provider. General instructions    Do not use any tobacco products, such as cigarettes, chewing tobacco, or e-cigarettes. Tobacco can delay healing. If you need help  quitting, ask your health care provider.  Take over-the-counter and prescription medicines only as told by your health care provider.  Keep all follow-up visits as told by your health care provider. This is important. How is this prevented?  Warm up and stretch before being active.  Cool down and stretch after being active.  Give your body time to rest between periods of activity.  Be safe and responsible while being active to avoid falls.  Maintain physical fitness, including strength and flexibility. Contact a health care provider if:  You continue to have pain after 6 weeks of treatment.  You have pain or other symptoms that get worse. This information is not intended to replace advice given to you by your health care provider. Make sure you discuss any questions you have with your health care provider. Document Released: 06/08/2005 Document Revised: 02/13/2016 Document Reviewed: 05/12/2015 Elsevier Interactive Patient Education  2017 [employer].

## 2017-08-17 ENCOUNTER — Ambulatory Visit (INDEPENDENT_AMBULATORY_CARE_PROVIDER_SITE_OTHER): Payer: 59 | Admitting: Family Medicine

## 2017-08-17 ENCOUNTER — Encounter: Payer: Self-pay | Admitting: Family Medicine

## 2017-08-17 VITALS — BP 130/98 | HR 68 | Ht 72.0 in | Wt 256.0 lb

## 2017-08-17 DIAGNOSIS — I10 Essential (primary) hypertension: Secondary | ICD-10-CM

## 2017-08-17 DIAGNOSIS — M5136 Other intervertebral disc degeneration, lumbar region: Secondary | ICD-10-CM

## 2017-08-17 MED ORDER — TRAMADOL HCL 50 MG PO TABS: 50.0000 mg | ORAL_TABLET | Freq: Three times a day (TID) | ORAL | 0 refills | Status: DC | PRN

## 2017-08-17 MED ORDER — PREDNISONE 10 MG PO TABS: 10.0000 mg | ORAL_TABLET | Freq: Every day | ORAL | 0 refills | Status: DC

## 2017-08-17 MED ORDER — CYCLOBENZAPRINE HCL 10 MG PO TABS: 10.0000 mg | ORAL_TABLET | Freq: Three times a day (TID) | ORAL | 0 refills | Status: DC | PRN

## 2017-08-17 MED ORDER — LOSARTAN POTASSIUM 100 MG PO TABS: 100.0000 mg | ORAL_TABLET | Freq: Every day | ORAL | 2 refills | Status: DC

## 2017-08-17 NOTE — Progress Notes (Signed)
Name: Erik Valdez   MRN: [id number]    DOB: 09/02/1965   Date:08/17/2017       Progress Note  Subjective  Chief Complaint  Chief Complaint  Patient presents with  . Back Pain    hurts in lower mid to lower back. Spasms are going up the back from lower to mid. Started 2 days ago while picking up boxes. Back doesn't hurt while "hunched over"  . Hypertension    Back Pain  This is a recurrent problem. The current episode started in the past 7 days (sunday). The problem occurs constantly. The problem has been waxing and waning since onset. The pain is present in the lumbar spine. The quality of the pain is described as aching. The pain does not radiate. The pain is moderate. The pain is the same all the time. The symptoms are aggravated by bending and twisting. Pertinent negatives include no abdominal pain, bladder incontinence, bowel incontinence, chest pain, dysuria, fever, headaches, numbness, paresis, tingling, weakness or weight loss. He has tried NSAIDs for the symptoms. The treatment provided mild relief.  Hypertension  This is a chronic problem. The current episode started more than 1 year ago. The problem is unchanged. The problem is controlled. Pertinent negatives include no anxiety, blurred vision, chest pain, headaches, malaise/fatigue, neck pain, orthopnea, palpitations, peripheral edema, PND, shortness of breath or sweats. There are no associated agents to hypertension. Risk factors for coronary artery disease include dyslipidemia. Past treatments include ACE inhibitors and angiotensin blockers. The current treatment provides mild improvement. There are no compliance problems.  There is no history of angina, kidney disease, CAD/MI, CVA, heart failure, left ventricular hypertrophy, PVD or retinopathy. There is no history of chronic renal disease, a hypertension causing med or renovascular disease.    No problem-specific Assessment & Plan notes found for this encounter.   Past Medical  History:  Diagnosis Date  . History of colonoscopy 12/21/2015   internal hemorrhoids- cleared for 10 years- Dr Servando SnareWohl  . Hypertension     Past Surgical History:  Procedure Laterality Date  . COLONOSCOPY WITH PROPOFOL N/A 01/16/2016   Procedure: COLONOSCOPY WITH PROPOFOL;  Surgeon: Midge Miniumarren Wohl, MD;  [facility];  Service: Endoscopy;  Laterality: N/A;  . NASAL SINUS SURGERY     age 89 or 8    History reviewed. No pertinent family history.  Social History   Socioeconomic History  . Marital status: Married    Spouse name: Not on file  . Number of children: Not on file  . Years of education: Not on file  . Highest education level: Not on file  Social Needs  . Financial resource strain: Not on file  . Food insecurity - worry: Not on file  . Food insecurity - inability: Not on file  . Transportation needs - medical: Not on file  . Transportation needs - non-medical: Not on file  Occupational History  . Not on file  Tobacco Use  . Smoking status: Never Smoker  . Smokeless tobacco: Current User    Types: Chew  . Tobacco comment: Smokeless tobacco - occasionally (golfing, etc)  Substance and Sexual Activity  . Alcohol use: Yes    Alcohol/week: 3.6 oz    Types: 6 Cans of beer per week  . Drug use: No  . Sexual activity: Not on file  Other Topics Concern  . Not on file  Social History Narrative  . Not on file    No Known Allergies  Outpatient Medications  Prior to Visit  Medication Sig Dispense Refill  . fluticasone (FLONASE) 50 MCG/ACT nasal spray Place 1 spray into both nostrils daily. 16 g 11  . loratadine (CLARITIN) 10 MG tablet Take 1 tablet (10 mg total) by mouth daily. 30 tablet 11  . losartan (COZAAR) 100 MG tablet Take 1 tablet (100 mg total) by mouth daily. 90 tablet 2  . hydrocortisone (ANUSOL-HC) 2.5 % rectal cream Place 1 application rectally 2 (two) times daily. (Patient not taking: Reported on 08/17/2017) 30 g 3  . cyclobenzaprine (FLEXERIL) 10  MG tablet Take 1 tablet (10 mg total) by mouth 3 (three) times daily as needed for muscle spasms. 30 tablet 0  . traMADol (ULTRAM) 50 MG tablet Take 1 tablet (50 mg total) by mouth every 8 (eight) hours as needed. 30 tablet 0   No facility-administered medications prior to visit.     Review of Systems  Constitutional: Negative for chills, fever, malaise/fatigue and weight loss.  HENT: Negative for ear discharge, ear pain and sore throat.   Eyes: Negative for blurred vision.  Respiratory: Negative for cough, sputum production, shortness of breath and wheezing.   Cardiovascular: Negative for chest pain, palpitations, orthopnea, leg swelling and PND.  Gastrointestinal: Negative for abdominal pain, blood in stool, bowel incontinence, constipation, diarrhea, heartburn, melena and nausea.  Genitourinary: Negative for bladder incontinence, dysuria, frequency, hematuria and urgency.  Musculoskeletal: Positive for back pain. Negative for joint pain, myalgias and neck pain.  Skin: Negative for rash.  Neurological: Negative for dizziness, tingling, sensory change, focal weakness, weakness, numbness and headaches.  Endo/Heme/Allergies: Negative for environmental allergies and polydipsia. Does not bruise/bleed easily.  Psychiatric/Behavioral: Negative for depression and suicidal ideas. The patient is not nervous/anxious and does not have insomnia.      Objective  Vitals:   08/17/17 0929  BP: (!) 130/98  Pulse: 68  Weight: 256 lb (116.1 kg)  Height: 6' (1.829 m)    Physical Exam  Constitutional: He is oriented to person, place, and time and well-developed, well-nourished, and in no distress.  HENT:  Head: Normocephalic.  Right Ear: External ear normal.  Left Ear: External ear normal.  Nose: Nose normal.  Mouth/Throat: Oropharynx is clear and moist.  Eyes: Conjunctivae and EOM are normal. Pupils are equal, round, and reactive to light. Right eye exhibits no discharge. Left eye exhibits no  discharge. No scleral icterus.  Neck: Normal range of motion. Neck supple. No JVD present. No tracheal deviation present. No thyromegaly present.  Cardiovascular: Normal rate, regular rhythm, normal heart sounds and intact distal pulses. Exam reveals no gallop and no friction rub.  No murmur heard. Pulmonary/Chest: Breath sounds normal. No respiratory distress. He has no wheezes. He has no rales.  Abdominal: Soft. Bowel sounds are normal. He exhibits no mass. There is no hepatosplenomegaly. There is no tenderness. There is no rebound, no guarding and no CVA tenderness.  Musculoskeletal: Normal range of motion. He exhibits no edema or tenderness.       Lumbar back: He exhibits spasm.  Lymphadenopathy:    He has no cervical adenopathy.  Neurological: He is alert and oriented to person, place, and time. He has normal sensation, normal strength, normal reflexes and intact cranial nerves. No cranial nerve deficit.  Skin: Skin is warm. No rash noted.  Psychiatric: Mood and affect normal.  Nursing note and vitals reviewed.     Assessment & Plan  Problem List Items Addressed This Visit    None    Visit Diagnoses  Degeneration of intervertebral disc of lumbar spine without disc herniation    -  Primary   Relevant Medications   traMADol (ULTRAM) 50 MG tablet   cyclobenzaprine (FLEXERIL) 10 MG tablet   predniSONE (DELTASONE) 10 MG tablet   Essential (primary) hypertension       Relevant Medications   losartan (COZAAR) 100 MG tablet      Meds ordered this encounter  Medications  . traMADol (ULTRAM) 50 MG tablet    Sig: Take 1 tablet (50 mg total) by mouth every 8 (eight) hours as needed.    Dispense:  30 tablet    Refill:  0  . cyclobenzaprine (FLEXERIL) 10 MG tablet    Sig: Take 1 tablet (10 mg total) by mouth 3 (three) times daily as needed for muscle spasms.    Dispense:  30 tablet    Refill:  0  . predniSONE (DELTASONE) 10 MG tablet    Sig: Take 1 tablet (10 mg total) by mouth  daily with breakfast.    Dispense:  30 tablet    Refill:  0  . losartan (COZAAR) 100 MG tablet    Sig: Take 1 tablet (100 mg total) by mouth daily.    Dispense:  90 tablet    Refill:  2      Dr. Elizabeth Sauer [facility]  08/17/17

## 2017-10-13 ENCOUNTER — Ambulatory Visit (INDEPENDENT_AMBULATORY_CARE_PROVIDER_SITE_OTHER): Payer: 59 | Admitting: Family Medicine

## 2017-10-13 ENCOUNTER — Encounter: Payer: Self-pay | Admitting: Family Medicine

## 2017-10-13 VITALS — BP 120/86 | HR 64 | Ht 72.0 in | Wt 260.0 lb

## 2017-10-13 DIAGNOSIS — E7800 Pure hypercholesterolemia, unspecified: Secondary | ICD-10-CM | POA: Diagnosis not present

## 2017-10-13 DIAGNOSIS — Z0000 Encounter for general adult medical examination without abnormal findings: Secondary | ICD-10-CM

## 2017-10-13 DIAGNOSIS — I10 Essential (primary) hypertension: Secondary | ICD-10-CM | POA: Diagnosis not present

## 2017-10-13 DIAGNOSIS — Z1211 Encounter for screening for malignant neoplasm of colon: Secondary | ICD-10-CM

## 2017-10-13 LAB — HEMOCCULT GUIAC POC 1CARD (OFFICE): FECAL OCCULT BLD: NEGATIVE

## 2017-10-13 NOTE — Progress Notes (Signed)
Name: Erik Valdez   MRN: [id number]    DOB: 1965-11-24   Date:10/13/2017       Progress Note  Subjective  Chief Complaint  Chief Complaint  Patient presents with  . Annual Exam    Patient presents for annual physical exam.   No problem-specific Assessment & Plan notes found for this encounter.   Past Medical History:  Diagnosis Date  . History of colonoscopy 12/21/2015   internal hemorrhoids- cleared for 10 years- Dr Servando Snare  . Hypertension     Past Surgical History:  Procedure Laterality Date  . COLONOSCOPY WITH PROPOFOL N/A 01/16/2016   Procedure: COLONOSCOPY WITH PROPOFOL;  Surgeon: Midge Minium, MD;  Location: [facility];  Service: Endoscopy;  Laterality: N/A;  . NASAL SINUS SURGERY     age 52 or 7    No family history on file.  Social History   Socioeconomic History  . Marital status: Married    Spouse name: Not on file  . Number of children: Not on file  . Years of education: Not on file  . Highest education level: Not on file  Occupational History  . Not on file  Social Needs  . Financial resource strain: Not on file  . Food insecurity:    Worry: Not on file    Inability: Not on file  . Transportation needs:    Medical: Not on file    Non-medical: Not on file  Tobacco Use  . Smoking status: Never Smoker  . Smokeless tobacco: Current User    Types: Chew  . Tobacco comment: Smokeless tobacco - occasionally (golfing, etc)  Substance and Sexual Activity  . Alcohol use: Yes    Alcohol/week: 3.6 oz    Types: 6 Cans of beer per week  . Drug use: No  . Sexual activity: Not on file  Lifestyle  . Physical activity:    Days per week: Not on file    Minutes per session: Not on file  . Stress: Not on file  Relationships  . Social connections:    Talks on phone: Not on file    Gets together: Not on file    Attends religious service: Not on file    Active member of club or organization: Not on file    Attends meetings of clubs or organizations:  Not on file    Relationship status: Not on file  . Intimate partner violence:    Fear of current or ex partner: Not on file    Emotionally abused: Not on file    Physically abused: Not on file    Forced sexual activity: Not on file  Other Topics Concern  . Not on file  Social History Narrative  . Not on file    No Known Allergies  Outpatient Medications Prior to Visit  Medication Sig Dispense Refill  . fluticasone (FLONASE) 50 MCG/ACT nasal spray Place 1 spray into both nostrils daily. 16 g 11  . loratadine (CLARITIN) 10 MG tablet Take 1 tablet (10 mg total) by mouth daily. 30 tablet 11  . losartan (COZAAR) 100 MG tablet Take 1 tablet (100 mg total) by mouth daily. 90 tablet 2  . hydrocortisone (ANUSOL-HC) 2.5 % rectal cream Place 1 application rectally 2 (two) times daily. (Patient not taking: Reported on 08/17/2017) 30 g 3  . cyclobenzaprine (FLEXERIL) 10 MG tablet Take 1 tablet (10 mg total) by mouth 3 (three) times daily as needed for muscle spasms. 30 tablet 0  . predniSONE (DELTASONE)  10 MG tablet Take 1 tablet (10 mg total) by mouth daily with breakfast. 30 tablet 0  . traMADol (ULTRAM) 50 MG tablet Take 1 tablet (50 mg total) by mouth every 8 (eight) hours as needed. 30 tablet 0   No facility-administered medications prior to visit.     Review of Systems  Constitutional: Negative for chills, fever, malaise/fatigue and weight loss.  HENT: Negative for ear discharge, ear pain and sore throat.   Eyes: Negative for blurred vision.  Respiratory: Negative for cough, sputum production, shortness of breath and wheezing.   Cardiovascular: Negative for chest pain, palpitations and leg swelling.  Gastrointestinal: Negative for abdominal pain, blood in stool, constipation, diarrhea, heartburn, melena and nausea.  Genitourinary: Negative for dysuria, frequency, hematuria and urgency.  Musculoskeletal: Negative for back pain, joint pain, myalgias and neck pain.  Skin: Negative for rash.   Neurological: Negative for dizziness, tingling, sensory change, focal weakness and headaches.  Endo/Heme/Allergies: Negative for environmental allergies and polydipsia. Does not bruise/bleed easily.  Psychiatric/Behavioral: Negative for depression and suicidal ideas. The patient is not nervous/anxious and does not have insomnia.      Objective  Vitals:   10/13/17 0825  BP: 120/86  Pulse: 64  Weight: 260 lb (117.9 kg)  Height: 6' (1.829 m)    Physical Exam  Constitutional: He is oriented to person, place, and time. Vital signs are normal. He appears well-developed and well-nourished.  HENT:  Head: Normocephalic and atraumatic.  Right Ear: Hearing, tympanic membrane, external ear and ear canal normal.  Left Ear: Hearing, tympanic membrane, external ear and ear canal normal.  Nose: Nose normal. No mucosal edema.  Mouth/Throat: Uvula is midline, oropharynx is clear and moist and mucous membranes are normal. Normal dentition. No oropharyngeal exudate, posterior oropharyngeal edema, posterior oropharyngeal erythema or tonsillar abscesses.  Eyes: Pupils are equal, round, and reactive to light. Conjunctivae and EOM are normal. Right eye exhibits no discharge. Left eye exhibits no discharge. No scleral icterus.  Fundoscopic exam:      The right eye shows no arteriolar narrowing, no AV nicking and no papilledema.       The left eye shows no arteriolar narrowing, no AV nicking and no papilledema.  Neck: Trachea normal, normal range of motion and full passive range of motion without pain. Neck supple. Normal carotid pulses, no hepatojugular reflux and no JVD present. No tracheal tenderness present. Carotid bruit is not present. No tracheal deviation present. No thyroid mass and no thyromegaly present.  Cardiovascular: Normal rate, regular rhythm, S1 normal, S2 normal, normal heart sounds, intact distal pulses and normal pulses. Exam reveals no gallop, no S3, no S4 and no friction rub.  No murmur  heard.  No systolic murmur is present.  No diastolic murmur is present. Pulses:      Carotid pulses are 2+ on the right side, and 2+ on the left side.      Radial pulses are 2+ on the right side, and 2+ on the left side.       Femoral pulses are 2+ on the right side, and 2+ on the left side.      Popliteal pulses are 2+ on the right side, and 2+ on the left side.       Dorsalis pedis pulses are 2+ on the right side, and 2+ on the left side.       Posterior tibial pulses are 2+ on the right side, and 2+ on the left side.  Pulmonary/Chest: Effort normal and  breath sounds normal. No stridor. No respiratory distress. He has no decreased breath sounds. He has no wheezes. He has no rhonchi. He has no rales.  Lipoma/right suprascapular  Abdominal: Soft. Normal appearance, normal aorta and bowel sounds are normal. He exhibits no mass. There is no hepatosplenomegaly. There is no tenderness. There is no rebound, no guarding and no CVA tenderness. Hernia confirmed negative in the right inguinal area and confirmed negative in the left inguinal area.  Genitourinary: Testes normal and penis normal. Rectal exam shows guaiac negative stool.  Musculoskeletal: Normal range of motion. He exhibits no edema or tenderness.       Cervical back: Normal.       Thoracic back: Normal.       Lumbar back: Normal.  Lymphadenopathy:       Head (right side): No submental and no submandibular adenopathy present.       Head (left side): No submental and no submandibular adenopathy present.    He has no cervical adenopathy.    He has no axillary adenopathy.  Neurological: He is alert and oriented to person, place, and time. He has normal strength and normal reflexes. No cranial nerve deficit or sensory deficit.  Reflex Scores:      Tricep reflexes are 2+ on the right side and 2+ on the left side.      Bicep reflexes are 2+ on the right side and 2+ on the left side.      Brachioradialis reflexes are 2+ on the right side and  2+ on the left side.      Patellar reflexes are 2+ on the right side and 2+ on the left side.      Achilles reflexes are 2+ on the right side and 2+ on the left side. Skin: Skin is warm, dry and intact. No rash noted. No pallor.  Nursing note and vitals reviewed.     Assessment & Plan  Problem List Items Addressed This Visit      Cardiovascular and Mediastinum   Essential hypertension   Relevant Orders   Renal function panel     Other   Pure hypercholesterolemia   Relevant Orders   Lipid panel    Other Visit Diagnoses    Annual physical exam    -  Primary   Relevant Orders   Renal function panel   POCT occult blood stool (Completed)   Colon cancer screening       Relevant Orders   POCT occult blood stool (Completed)      No orders of the defined types were placed in this encounter. Erik Valdez is a 52 y.o. male who presents today for his Complete Annual Exam. He feels well. He reports exercising . He reports he is sleeping well.  Immunizations are reviewed and recommendations provided.   Age appropriate screening tests are discussed. Counseling given for risk factor reduction interventions.   Dr. [facility]  10/13/17

## 2017-10-14 LAB — RENAL FUNCTION PANEL
Albumin: 4.5 g/dL (ref 3.5–5.5)
BUN/Creatinine Ratio: 12 (ref 9–20)
BUN: 11 mg/dL (ref 6–24)
CALCIUM: 9.7 mg/dL (ref 8.7–10.2)
CHLORIDE: 103 mmol/L (ref 96–106)
CO2: 22 mmol/L (ref 20–29)
CREATININE: 0.93 mg/dL (ref 0.76–1.27)
GFR calc Af Amer: 110 mL/min/{1.73_m2} (ref >59)
GFR calc non Af Amer: 95 mL/min/{1.73_m2} (ref >59)
Glucose: 104 mg/dL — ABNORMAL HIGH (ref 65–99)
PHOSPHORUS: 3.1 mg/dL (ref 2.5–4.5)
Potassium: 4.8 mmol/L (ref 3.5–5.2)
SODIUM: 139 mmol/L (ref 134–144)

## 2017-10-14 LAB — LIPID PANEL
CHOLESTEROL TOTAL: 214 mg/dL — AB (ref 100–199)
Chol/HDL Ratio: 5.8 ratio — ABNORMAL HIGH (ref 0.0–5.0)
HDL: 37 mg/dL — ABNORMAL LOW (ref >39)
LDL CALC: 146 mg/dL — AB (ref 0–99)
TRIGLYCERIDES: 157 mg/dL — AB (ref 0–149)
VLDL Cholesterol Cal: 31 mg/dL (ref 5–40)

## 2017-11-16 ENCOUNTER — Encounter: Payer: Self-pay | Admitting: Family Medicine

## 2017-11-16 ENCOUNTER — Ambulatory Visit (INDEPENDENT_AMBULATORY_CARE_PROVIDER_SITE_OTHER): Payer: 59 | Admitting: Family Medicine

## 2017-11-16 VITALS — BP 138/110 | HR 90 | Ht 72.0 in | Wt 263.0 lb

## 2017-11-16 DIAGNOSIS — E6609 Other obesity due to excess calories: Secondary | ICD-10-CM

## 2017-11-16 DIAGNOSIS — Z6834 Body mass index (BMI) 34.0-34.9, adult: Secondary | ICD-10-CM | POA: Diagnosis not present

## 2017-11-16 DIAGNOSIS — I10 Essential (primary) hypertension: Secondary | ICD-10-CM | POA: Diagnosis not present

## 2017-11-16 DIAGNOSIS — M5442 Lumbago with sciatica, left side: Secondary | ICD-10-CM

## 2017-11-16 MED ORDER — HYDROCHLOROTHIAZIDE 12.5 MG PO TABS: 12.5000 mg | ORAL_TABLET | Freq: Every day | ORAL | 3 refills | Status: DC

## 2017-11-16 MED ORDER — CYCLOBENZAPRINE HCL 10 MG PO TABS: 10.0000 mg | ORAL_TABLET | Freq: Three times a day (TID) | ORAL | 0 refills | Status: DC | PRN

## 2017-11-16 MED ORDER — MELOXICAM 15 MG PO TABS: 15.0000 mg | ORAL_TABLET | Freq: Every day | ORAL | 0 refills | Status: DC

## 2017-11-16 NOTE — Assessment & Plan Note (Signed)
Recurrence with physical activity and recent weight gain. Refill previous Nsaid/muscle relaxant regimen

## 2017-11-16 NOTE — Patient Instructions (Signed)
Herniated Disk A herniated disk, also called a ruptured disk or slipped disk, occurs when a disk in the spine bulges out too far. Between the bones in the spine (vertebrae), there are oval disks that are made of a soft, spongy center that is surrounded by a tough outer ring. The disks connect your vertebrae, help your spine move, and absorb shocks from your movement. When you have a herniated disk, the spongy center of the disk bulges out or breaks through the outer ring. It can press on a nerve between the vertebrae and cause pain. This can occur anywhere in the back or neck area, but the lower back is most commonly affected. What are the causes? This condition may be caused by:  Age-related wear and tear. The spongy centers of spinal disks tend to shrink and dry out with age, which makes them more likely to herniate.  Sudden injury, such as a strain or sprain.  What increases the risk? Aging is the main risk factor for a herniated disk. Other risk factors include:  Being a man who is 30-50 years old.  Frequently doing activities that involve heavy lifting, bending, or twisting.  Frequently driving for long hours at a time.  Not getting enough exercise.  Being overweight.  Smoking.  Having a family history of back problems or herniated disks.  Being pregnant or giving birth.  Having poor nutrition.  Being tall.  What are the signs or symptoms? Symptoms may vary depending on where your herniated disk is located.  A herniated disk in the lower back may cause sharp pain in: ? Part of the arm, leg, hip, or buttocks. ? The back of the lower leg (calf). ? The lower back, spreading down through the leg into the foot (sciatica).  A herniated disk in the neck may cause dizziness and vertigo. It may also cause pain or weakness in: ? The neck. ? The shoulder blades. ? Upper arm, forearm, or fingers.  You may also have muscle weakness. It may be difficult to: ? Lift your leg or  arm. ? Stand on your toes. ? Squeeze tightly with one of your hands.  Other symptoms may include: ? Numbness or tingling in the affected areas of the hands, arms, feet, or legs. ? Inability to control when you urinate or when you have bowel movements. This is a rare but serious sign of a severe herniated disk in the lower back.  How is this diagnosed? This condition may be diagnosed based on:  Your symptoms.  Your medical history.  A physical exam. The exam may include: ? Straight-leg test. You will lie on your back while your health care provider lifts your leg, keeping your knee straight. If you feel pain, you likely have a herniated disk. ? Neurological tests. This includes checking for numbness, reflexes, muscle strength, and posture.  Imaging tests, such as: ? X-rays. ? MRI. ? CT scan. ? Electromyogram (EMG) to check the nerves that control muscles. This test may be used to determine which nerves are affected by your herniated disk.  How is this treated? Treatment for this condition may include:  A short period of rest. This is usually the first treatment. ? You may be on bed rest for up to 2 days, or you may be instructed to stay home and avoid physical activity. ? If you have a herniated disk in your lower back, avoid sitting as much as possible. Sitting increases pressure on the disk.  Medicines. These may   include: ? NSAIDs to help reduce pain and swelling. ? Muscle relaxants to prevent sudden tightening of the back muscles (back spasms). ? Prescription pain medicines, if you have severe pain.  Steroid injections in the area of the herniated disk. This can help reduce pain and swelling.  Physical therapy to strengthen your back muscles.  In many cases, symptoms go away with treatment over a period of days or weeks. You will most likely be free of symptoms after 3-4 months. If other treatments do not help to relieve your symptoms, you may need surgery. Follow these  instructions at home: Medicines  Take over-the-counter and prescription medicines only as told by your health care provider.  Do not drive or use heavy machinery while taking prescription pain medicine. Activity  Rest as directed.  After your rest period: ? Return to your normal activities and gradually begin exercising as told by your health care provider. Ask your health care provider what activities and exercises are safe for you. ? Use good posture. ? Avoid movements that cause pain. ? Do not lift anything that is heavier than 10 lb (4.5 kg) until your health care provider says this is safe. ? Do not sit or stand for long periods of time without changing positions. ? Do not sit for long periods of time without getting up and moving around.  If physical therapy was prescribed, do exercises as instructed.  Aim to strengthen muscles in your back and abdomen with exercises like crunches, swimming, or walking. General instructions  Do not use any products that contain nicotine or tobacco, such as cigarettes and e-cigarettes. These products can delay healing. If you need help quitting, ask your health care provider.  Do not wear high-heeled shoes.  Do not sleep on your belly.  If you are overweight, work with your health care provider to lose weight safely.  To prevent or treat constipation while you are taking prescription pain medicine, your health care provider may recommend that you: ? Drink enough fluid to keep your urine clear or pale yellow. ? Take over-the-counter or prescription medicines. ? Eat foods that are high in fiber, such as fresh fruits and vegetables, whole grains, and beans. ? Limit foods that are high in fat and processed sugars, such as fried and sweet foods.  Keep all follow-up visits as told by your health care provider. This is important. How is this prevented?  Maintain a healthy weight.  Try to avoid stressful situations.  Maintain physical  fitness. Do at least 150 minutes of moderate-intensity exercise each week, such as brisk walking or water aerobics.  When lifting objects: ? Keep your feet at least shoulder-width apart and tighten your abdominal muscles. ? Keep your spine neutral as you bend your knees and hips. It is important to lift using the strength of your legs, not your back. Do not lock your knees straight out. ? Always ask for help to lift heavy or awkward objects. Contact a health care provider if:  You have back pain or neck pain that does not get better after 6 weeks.  You have severe pain in your back, neck, legs, or arms.  You develop numbness, tingling, or weakness in any part of your body. Get help right away if:  You cannot move your arms or legs.  You cannot control when you urinate or have bowel movements.  You feel dizzy or you faint.  You have shortness of breath. This information is not intended to replace advice given   to you by your health care provider. Make sure you discuss any questions you have with your health care provider. Document Released: 06/05/2000 Document Revised: 02/03/2016 Document Reviewed: 02/03/2016 Elsevier Interactive Patient Education  2017 [employer]  

## 2017-11-16 NOTE — Assessment & Plan Note (Signed)
Uncontrolled on present dosage losartin,will add HCTZ.

## 2017-11-16 NOTE — Progress Notes (Signed)
Name: Erik Valdez   MRN: [id number]    DOB: 09/05/1965   Date:11/16/2017       Progress Note  Subjective  Chief Complaint  Chief Complaint  Patient presents with  . Back Pain    was playing golf on Sat- twisted back and it "locked up"    Back Pain  This is a recurrent (While playing golf ,took a swing.Marland KitchenMarland KitchenMarland KitchenMarland Kitchen) problem. The current episode started in the past 7 days (Saturday). The problem occurs constantly. The problem has been waxing and waning since onset. The pain is present in the lumbar spine. The quality of the pain is described as aching. Radiates to: left buttuck. The pain is at a severity of 5/10. The pain is moderate. The symptoms are aggravated by bending and twisting. Associated symptoms include leg pain. Pertinent negatives include no abdominal pain, bladder incontinence, bowel incontinence, chest pain, dysuria, fever, headaches, numbness, paresis, paresthesias, perianal numbness, tingling, weakness or weight loss. He has tried NSAIDs and muscle relaxant for the symptoms. The treatment provided moderate relief.    Acute back pain with sciatica Recurrence with physical activity and recent weight gain. Refill previous Nsaid/muscle relaxant regimen  Essential hypertension Uncontrolled on present dosage losartin,will add HCTZ.    Past Medical History:  Diagnosis Date  . History of colonoscopy 12/21/2015   internal hemorrhoids- cleared for 10 years- Dr Servando Snare  . Hypertension     Past Surgical History:  Procedure Laterality Date  . COLONOSCOPY WITH PROPOFOL N/A 01/16/2016   Procedure: COLONOSCOPY WITH PROPOFOL;  Surgeon: Midge Minium, MD;  Location: [facility];  Service: Endoscopy;  Laterality: N/A;  . NASAL SINUS SURGERY     age 52 or 7    No family history on file.  Social History   Socioeconomic History  . Marital status: Married    Spouse name: Not on file  . Number of children: Not on file  . Years of education: Not on file  . Highest education level:  Not on file  Occupational History  . Not on file  Social Needs  . Financial resource strain: Not on file  . Food insecurity:    Worry: Not on file    Inability: Not on file  . Transportation needs:    Medical: Not on file    Non-medical: Not on file  Tobacco Use  . Smoking status: Never Smoker  . Smokeless tobacco: Current User    Types: Chew  . Tobacco comment: Smokeless tobacco - occasionally (golfing, etc)  Substance and Sexual Activity  . Alcohol use: Yes    Alcohol/week: 3.6 oz    Types: 6 Cans of beer per week  . Drug use: No  . Sexual activity: Not on file  Lifestyle  . Physical activity:    Days per week: Not on file    Minutes per session: Not on file  . Stress: Not on file  Relationships  . Social connections:    Talks on phone: Not on file    Gets together: Not on file    Attends religious service: Not on file    Active member of club or organization: Not on file    Attends meetings of clubs or organizations: Not on file    Relationship status: Not on file  . Intimate partner violence:    Fear of current or ex partner: Not on file    Emotionally abused: Not on file    Physically abused: Not on file    Forced sexual  activity: Not on file  Other Topics Concern  . Not on file  Social History Narrative  . Not on file    No Known Allergies  Outpatient Medications Prior to Visit  Medication Sig Dispense Refill  . fluticasone (FLONASE) 50 MCG/ACT nasal spray Place 1 spray into both nostrils daily. 16 g 11  . loratadine (CLARITIN) 10 MG tablet Take 1 tablet (10 mg total) by mouth daily. 30 tablet 11  . losartan (COZAAR) 100 MG tablet Take 1 tablet (100 mg total) by mouth daily. 90 tablet 2  . hydrocortisone (ANUSOL-HC) 2.5 % rectal cream Place 1 application rectally 2 (two) times daily. (Patient not taking: Reported on 08/17/2017) 30 g 3   No facility-administered medications prior to visit.     Review of Systems  Constitutional: Negative for chills,  fever, malaise/fatigue and weight loss.  HENT: Negative for ear discharge, ear pain and sore throat.   Eyes: Negative for blurred vision.  Respiratory: Negative for cough, sputum production, shortness of breath and wheezing.   Cardiovascular: Negative for chest pain, palpitations and leg swelling.  Gastrointestinal: Negative for abdominal pain, blood in stool, bowel incontinence, constipation, diarrhea, heartburn, melena and nausea.  Genitourinary: Negative for bladder incontinence, dysuria, frequency, hematuria and urgency.  Musculoskeletal: Positive for back pain. Negative for joint pain, myalgias and neck pain.  Skin: Negative for rash.  Neurological: Negative for dizziness, tingling, sensory change, focal weakness, weakness, numbness, headaches and paresthesias.  Endo/Heme/Allergies: Negative for environmental allergies and polydipsia. Does not bruise/bleed easily.  Psychiatric/Behavioral: Negative for depression and suicidal ideas. The patient is not nervous/anxious and does not have insomnia.      Objective  Vitals:   11/16/17 0823  BP: (!) 138/110  Pulse: 90  Weight: 263 lb (119.3 kg)  Height: 6' (1.829 m)    Physical Exam  Constitutional: He is oriented to person, place, and time.  HENT:  Head: Normocephalic.  Right Ear: External ear normal.  Left Ear: External ear normal.  Nose: Nose normal.  Mouth/Throat: Oropharynx is clear and moist.  Eyes: Pupils are equal, round, and reactive to light. Conjunctivae and EOM are normal. Right eye exhibits no discharge. Left eye exhibits no discharge. No scleral icterus.  Neck: Normal range of motion. Neck supple. No JVD present. No tracheal deviation present. No thyromegaly present.  Cardiovascular: Normal rate, regular rhythm, normal heart sounds and intact distal pulses. Exam reveals no gallop and no friction rub.  No murmur heard. Pulmonary/Chest: Breath sounds normal. No respiratory distress. He has no wheezes. He has no rales.   Abdominal: Soft. Bowel sounds are normal. He exhibits no mass. There is no hepatosplenomegaly. There is no tenderness. There is no rebound, no guarding and no CVA tenderness.  Musculoskeletal: He exhibits no edema or tenderness.       Lumbar back: He exhibits decreased range of motion and spasm. He exhibits no tenderness and no bony tenderness.  Lymphadenopathy:    He has no cervical adenopathy.  Neurological: He is alert and oriented to person, place, and time. He has normal strength and normal reflexes. No cranial nerve deficit or sensory deficit.  Positive SLR bilateral R>L  Skin: Skin is warm. No rash noted.  Nursing note and vitals reviewed.     Assessment & Plan  Problem List Items Addressed This Visit      Cardiovascular and Mediastinum   Essential hypertension    Uncontrolled on present dosage losartin,will add HCTZ.       Relevant Medications  hydrochlorothiazide (HYDRODIURIL) 12.5 MG tablet     Other   Acute back pain with sciatica - Primary    Recurrence with physical activity and recent weight gain. Refill previous Nsaid/muscle relaxant regimen      Relevant Medications   meloxicam (MOBIC) 15 MG tablet   cyclobenzaprine (FLEXERIL) 10 MG tablet    Other Visit Diagnoses    Class 1 obesity due to excess calories without serious comorbidity with body mass index (BMI) of 34.0 to 34.9 in adult       recent weight gain ,will initiate diet for medical necessity/letter of necessity written for diet plan      Meds ordered this encounter  Medications  . hydrochlorothiazide (HYDRODIURIL) 12.5 MG tablet    Sig: Take 1 tablet (12.5 mg total) by mouth daily.    Dispense:  30 tablet    Refill:  3  . meloxicam (MOBIC) 15 MG tablet    Sig: Take 1 tablet (15 mg total) by mouth daily.    Dispense:  30 tablet    Refill:  0  . cyclobenzaprine (FLEXERIL) 10 MG tablet    Sig: Take 1 tablet (10 mg total) by mouth 3 (three) times daily as needed for muscle spasms.     Dispense:  30 tablet    Refill:  0      Dr. Hayden [facility]  11/16/17

## 2018-01-04 ENCOUNTER — Ambulatory Visit (INDEPENDENT_AMBULATORY_CARE_PROVIDER_SITE_OTHER): Payer: 59 | Admitting: Family Medicine

## 2018-01-04 ENCOUNTER — Encounter: Payer: Self-pay | Admitting: Family Medicine

## 2018-01-04 VITALS — BP 110/80 | HR 68 | Ht 72.0 in | Wt 263.0 lb

## 2018-01-04 DIAGNOSIS — I10 Essential (primary) hypertension: Secondary | ICD-10-CM | POA: Diagnosis not present

## 2018-01-04 MED ORDER — LOSARTAN POTASSIUM-HCTZ 50-12.5 MG PO TABS: 1.0000 | ORAL_TABLET | Freq: Every day | ORAL | 1 refills | Status: DC

## 2018-01-04 NOTE — Assessment & Plan Note (Signed)
Continue on present regimen and recheck renal panel

## 2018-01-04 NOTE — Progress Notes (Signed)
Name: Erik Valdez   MRN: [id number]    DOB: [birth year]   Date:01/04/2018       Progress Note  Subjective  Chief Complaint  Chief Complaint  Patient presents with  . Hypertension    added HCTZ to losartan    Hypertension  This is a chronic problem. The current episode started more than 1 year ago. The problem has been gradually improving since onset. The problem is controlled. Pertinent negatives include no anxiety, blurred vision, chest pain, headaches, malaise/fatigue, neck pain, orthopnea, palpitations, peripheral edema, PND, shortness of breath or sweats. There are no associated agents to hypertension. Past treatments include angiotensin blockers and diuretics. There are no compliance problems.  There is no history of angina, kidney disease, CAD/MI, CVA, heart failure, left ventricular hypertrophy, PVD or retinopathy. There is no history of chronic renal disease, a hypertension causing med or renovascular disease.    Essential hypertension Continue on present regimen and recheck renal panel   Past Medical History:  Diagnosis Date  . History of colonoscopy 12/21/2015   internal hemorrhoids- cleared for 10 years- Dr Servando Snare  . Hypertension     Past Surgical History:  Procedure Laterality Date  . COLONOSCOPY WITH PROPOFOL N/A 01/16/2016   Procedure: COLONOSCOPY WITH PROPOFOL;  Surgeon: Midge Minium, MD;  Location: [facility];  Service: Endoscopy;  Laterality: N/A;  . NASAL SINUS SURGERY     age 52 or 8    History reviewed. No pertinent family history.  Social History   Socioeconomic History  . Marital status: Married    Spouse name: Not on file  . Number of children: Not on file  . Years of education: Not on file  . Highest education level: Not on file  Occupational History  . Not on file  Social Needs  . Financial resource strain: Not on file  . Food insecurity:    Worry: Not on file    Inability: Not on file  . Transportation needs:    Medical: Not on file     Non-medical: Not on file  Tobacco Use  . Smoking status: Never Smoker  . Smokeless tobacco: Current User    Types: Chew  . Tobacco comment: Smokeless tobacco - occasionally (golfing, etc)  Substance and Sexual Activity  . Alcohol use: Yes    Alcohol/week: 3.6 oz    Types: 6 Cans of beer per week  . Drug use: No  . Sexual activity: Not on file  Lifestyle  . Physical activity:    Days per week: Not on file    Minutes per session: Not on file  . Stress: Not on file  Relationships  . Social connections:    Talks on phone: Not on file    Gets together: Not on file    Attends religious service: Not on file    Active member of club or organization: Not on file    Attends meetings of clubs or organizations: Not on file    Relationship status: Not on file  . Intimate partner violence:    Fear of current or ex partner: Not on file    Emotionally abused: Not on file    Physically abused: Not on file    Forced sexual activity: Not on file  Other Topics Concern  . Not on file  Social History Narrative  . Not on file    No Known Allergies  Outpatient Medications Prior to Visit  Medication Sig Dispense Refill  . fluticasone (FLONASE) 50 MCG/ACT  nasal spray Place 1 spray into both nostrils daily. 16 g 11  . loratadine (CLARITIN) 10 MG tablet Take 1 tablet (10 mg total) by mouth daily. 30 tablet 11  . hydrochlorothiazide (HYDRODIURIL) 12.5 MG tablet Take 1 tablet (12.5 mg total) by mouth daily. 30 tablet 3  . losartan (COZAAR) 100 MG tablet Take 1 tablet (100 mg total) by mouth daily. 90 tablet 2  . cyclobenzaprine (FLEXERIL) 10 MG tablet Take 1 tablet (10 mg total) by mouth 3 (three) times daily as needed for muscle spasms. (Patient not taking: Reported on 01/04/2018) 30 tablet 0  . hydrocortisone (ANUSOL-HC) 2.5 % rectal cream Place 1 application rectally 2 (two) times daily. (Patient not taking: Reported on 08/17/2017) 30 g 3  . meloxicam (MOBIC) 15 MG tablet Take 1 tablet (15 mg  total) by mouth daily. (Patient not taking: Reported on 01/04/2018) 30 tablet 0   No facility-administered medications prior to visit.     Review of Systems  Constitutional: Negative for chills, fever, malaise/fatigue and weight loss.  HENT: Negative for ear discharge, ear pain and sore throat.   Eyes: Negative for blurred vision.  Respiratory: Negative for cough, sputum production, shortness of breath and wheezing.   Cardiovascular: Negative for chest pain, palpitations, orthopnea, leg swelling and PND.  Gastrointestinal: Negative for abdominal pain, blood in stool, constipation, diarrhea, heartburn, melena and nausea.  Genitourinary: Negative for dysuria, frequency, hematuria and urgency.  Musculoskeletal: Negative for back pain, joint pain, myalgias and neck pain.  Skin: Negative for rash.  Neurological: Negative for dizziness, tingling, sensory change, focal weakness and headaches.  Endo/Heme/Allergies: Negative for environmental allergies and polydipsia. Does not bruise/bleed easily.  Psychiatric/Behavioral: Negative for depression and suicidal ideas. The patient is not nervous/anxious and does not have insomnia.      Objective  Vitals:   01/04/18 0924  BP: 110/80  Pulse: 68  Weight: 263 lb (119.3 kg)  Height: 6' (1.829 m)    Physical Exam  Constitutional: He is oriented to person, place, and time.  HENT:  Head: Normocephalic.  Right Ear: External ear normal.  Left Ear: External ear normal.  Nose: Nose normal.  Mouth/Throat: Oropharynx is clear and moist.  Eyes: Pupils are equal, round, and reactive to light. Conjunctivae and EOM are normal. Right eye exhibits no discharge. Left eye exhibits no discharge. No scleral icterus.  Neck: Normal range of motion. Neck supple. No JVD present. No tracheal deviation present. No thyromegaly present.  Cardiovascular: Normal rate, regular rhythm, normal heart sounds and intact distal pulses. Exam reveals no gallop and no friction rub.   No murmur heard. Pulmonary/Chest: Breath sounds normal. No respiratory distress. He has no wheezes. He has no rales.  Abdominal: Soft. Bowel sounds are normal. He exhibits no mass. There is no hepatosplenomegaly. There is no tenderness. There is no rebound, no guarding and no CVA tenderness.  Musculoskeletal: Normal range of motion. He exhibits no edema or tenderness.  Lymphadenopathy:    He has no cervical adenopathy.  Neurological: He is alert and oriented to person, place, and time. He has normal strength and normal reflexes. No cranial nerve deficit.  Skin: Skin is warm. No rash noted.  Nursing note and vitals reviewed.     Assessment & Plan  Problem List Items Addressed This Visit      Cardiovascular and Mediastinum   Essential hypertension - Primary    Continue on present regimen and recheck renal panel      Relevant Medications   losartan-hydrochlorothiazide (  HYZAAR) 50-12.5 MG tablet   Other Relevant Orders   Renal Function Panel      Meds ordered this encounter  Medications  . losartan-hydrochlorothiazide (HYZAAR) 50-12.5 MG tablet    Sig: Take 1 tablet by mouth daily.    Dispense:  90 tablet    Refill:  1      Dr. [facility]uereanna [facility]  01/04/18

## 2018-01-05 LAB — RENAL FUNCTION PANEL
Albumin: 4.3 g/dL (ref 3.5–5.5)
BUN / CREAT RATIO: 13 (ref 9–20)
BUN: 11 mg/dL (ref 6–24)
CALCIUM: 9.5 mg/dL (ref 8.7–10.2)
CHLORIDE: 99 mmol/L (ref 96–106)
CO2: 20 mmol/L (ref 20–29)
Creatinine, Ser: 0.88 mg/dL (ref 0.76–1.27)
GFR calc Af Amer: 116 mL/min/{1.73_m2} (ref >59)
GFR calc non Af Amer: 100 mL/min/{1.73_m2} (ref >59)
Glucose: 99 mg/dL (ref 65–99)
PHOSPHORUS: 3 mg/dL (ref 2.5–4.5)
Potassium: 4 mmol/L (ref 3.5–5.2)
Sodium: 138 mmol/L (ref 134–144)

## 2018-01-18 ENCOUNTER — Telehealth: Payer: Self-pay

## 2018-01-18 NOTE — Telephone Encounter (Signed)
Overdue for AWV. Called pt to sched appt w/ [facility]. LVM requesting returned call. 

## 2018-07-07 ENCOUNTER — Encounter: Payer: Self-pay | Admitting: Family Medicine

## 2018-07-07 ENCOUNTER — Ambulatory Visit (INDEPENDENT_AMBULATORY_CARE_PROVIDER_SITE_OTHER): Payer: 59 | Admitting: Family Medicine

## 2018-07-07 VITALS — BP 110/80 | HR 72 | Ht 72.0 in | Wt 274.0 lb

## 2018-07-07 DIAGNOSIS — I10 Essential (primary) hypertension: Secondary | ICD-10-CM | POA: Diagnosis not present

## 2018-07-07 DIAGNOSIS — K644 Residual hemorrhoidal skin tags: Secondary | ICD-10-CM | POA: Diagnosis not present

## 2018-07-07 MED ORDER — HYDROCORTISONE 2.5 % RE CREA: 1.0000 "application " | TOPICAL_CREAM | Freq: Two times a day (BID) | RECTAL | 3 refills | Status: DC

## 2018-07-07 MED ORDER — LOSARTAN POTASSIUM 100 MG PO TABS: 100.0000 mg | ORAL_TABLET | Freq: Every day | ORAL | 1 refills | Status: DC

## 2018-07-07 NOTE — Progress Notes (Signed)
Date:  07/07/2018   Name:  Erik Valdez   DOB:  11/20/1965   MRN:  [id number]   Chief Complaint: Allergic Rhinitis  and Hypertension  Hypertension  This is a chronic problem. The current episode started more than 1 year ago. The problem is unchanged. The problem is controlled. Pertinent negatives include no anxiety, blurred vision, chest pain, headaches, malaise/fatigue, neck pain, orthopnea, palpitations, peripheral edema, PND, shortness of breath or sweats. There are no associated agents to hypertension. There are no known risk factors for coronary artery disease. Past treatments include angiotensin blockers. The current treatment provides moderate improvement. There are no compliance problems.  There is no history of angina, kidney disease, CAD/MI, CVA, heart failure, left ventricular hypertrophy, PVD or retinopathy. There is no history of chronic renal disease, a hypertension causing med or renovascular disease.    Review of Systems  Constitutional: Negative for chills, fever and malaise/fatigue.  HENT: Negative for drooling, ear discharge, ear pain and sore throat.   Eyes: Negative for blurred vision.  Respiratory: Negative for cough, shortness of breath and wheezing.   Cardiovascular: Negative for chest pain, palpitations, orthopnea, leg swelling and PND.  Gastrointestinal: Negative for abdominal pain, blood in stool, constipation, diarrhea and nausea.  Endocrine: Negative for polydipsia.  Genitourinary: Negative for dysuria, frequency, hematuria and urgency.  Musculoskeletal: Negative for back pain, myalgias and neck pain.  Skin: Negative for rash.  Allergic/Immunologic: Negative for environmental allergies.  Neurological: Negative for dizziness and headaches.  Hematological: Does not bruise/bleed easily.  Psychiatric/Behavioral: Negative for suicidal ideas. The patient is not nervous/anxious.     Patient Active Problem List   Diagnosis Date Noted  . Pure  hypercholesterolemia 10/13/2017  . External hemorrhoid 09/28/2016  . Mixed hyperlipidemia 09/28/2016  . Chronic seasonal allergic rhinitis due to pollen 09/28/2016  . Blood in stool   . Laboratory animal allergy 10/26/2014  . Acute back pain with sciatica 10/26/2014  . Routine general medical examination at a health care facility 10/26/2014  . Essential hypertension 10/26/2014  . Adult BMI 30+ 10/26/2014    No Known Allergies  Past Surgical History:  Procedure Laterality Date  . COLONOSCOPY WITH PROPOFOL N/A 01/16/2016   Procedure: COLONOSCOPY WITH PROPOFOL;  Surgeon: Midge Miniumarren Wohl, MD;  [facility];  Service: Endoscopy;  Laterality: N/A;  . NASAL SINUS SURGERY     age 89 or 8    Social History   Tobacco Use  . Smoking status: Never Smoker  . Smokeless tobacco: Former NeurosurgeonUser    Types: Chew  . Tobacco comment: Smokeless tobacco - occasionally (golfing, etc)  Substance Use Topics  . Alcohol use: Yes    Alcohol/week: 6.0 standard drinks    Types: 6 Cans of beer per week  . Drug use: No     Medication list has been reviewed and updated.  Current Meds  Medication Sig  . fluticasone (FLONASE) 50 MCG/ACT nasal spray Place 1 spray into both nostrils daily.  Marland Kitchen. loratadine (CLARITIN) 10 MG tablet Take 1 tablet (10 mg total) by mouth daily.  Marland Kitchen. losartan (COZAAR) 100 MG tablet Take 100 mg by mouth daily.  . [DISCONTINUED] losartan-hydrochlorothiazide (HYZAAR) 50-12.5 MG tablet Take 1 tablet by mouth daily. (Patient taking differently: Take 1 tablet by mouth daily. Taking 100mg  losartan only)    PHQ 2/9 Scores 12/19/2015  PHQ - 2 Score 0    Physical Exam Vitals signs and nursing note reviewed.  HENT:     Head: Normocephalic.  Right Ear: External ear normal.     Left Ear: External ear normal.     Nose: Nose normal.  Eyes:     General: No scleral icterus.       Right eye: No discharge.        Left eye: No discharge.     Conjunctiva/sclera: Conjunctivae  normal.     Pupils: Pupils are equal, round, and reactive to light.  Neck:     Musculoskeletal: Normal range of motion and neck supple.     Thyroid: No thyromegaly.     Vascular: No JVD.     Trachea: No tracheal deviation.  Cardiovascular:     Rate and Rhythm: Normal rate and regular rhythm.     Heart sounds: Normal heart sounds. No murmur. No friction rub. No gallop.   Pulmonary:     Effort: No respiratory distress.     Breath sounds: Normal breath sounds. No wheezing or rales.  Abdominal:     General: Bowel sounds are normal.     Palpations: Abdomen is soft. There is no mass.     Tenderness: There is no abdominal tenderness. There is no guarding or rebound.  Musculoskeletal: Normal range of motion.        General: No tenderness.  Lymphadenopathy:     Cervical: No cervical adenopathy.  Skin:    General: Skin is warm.     Findings: No rash.  Neurological:     Mental Status: He is alert and oriented to person, place, and time.     Cranial Nerves: No cranial nerve deficit.     Deep Tendon Reflexes: Reflexes are normal and symmetric.     BP 110/80   Pulse 72   Ht 6' (1.829 m)   Wt 274 lb (124.3 kg)   BMI 37.16 kg/m   Assessment and Plan: 1. External hemorrhoid external hemorrhoid causing discomfort. Prescribed rectal cream and suggested sitz bath.  - hydrocortisone (ANUSOL-HC) 2.5 % rectal cream; Place 1 application rectally 2 (two) times daily.  Dispense: 30 g; Refill: 3  2. Essential hypertension Chronic. Stable on med. Patient didn't pick up losartan/HCTZ- instead stayed on losartan 100mg  qday. Blood pressure controlled on med- refill losartan - losartan (COZAAR) 100 MG tablet; Take 1 tablet (100 mg total) by mouth daily.  Dispense: 90 tablet; Refill: 1

## 2019-01-05 ENCOUNTER — Other Ambulatory Visit: Payer: Self-pay

## 2019-01-05 ENCOUNTER — Ambulatory Visit (INDEPENDENT_AMBULATORY_CARE_PROVIDER_SITE_OTHER): Payer: 59 | Admitting: Family Medicine

## 2019-01-05 ENCOUNTER — Encounter: Payer: Self-pay | Admitting: Family Medicine

## 2019-01-05 VITALS — BP 120/80 | HR 60 | Ht 72.0 in | Wt 254.0 lb

## 2019-01-05 DIAGNOSIS — I10 Essential (primary) hypertension: Secondary | ICD-10-CM

## 2019-01-05 DIAGNOSIS — R351 Nocturia: Secondary | ICD-10-CM | POA: Diagnosis not present

## 2019-01-05 DIAGNOSIS — Z0000 Encounter for general adult medical examination without abnormal findings: Secondary | ICD-10-CM | POA: Diagnosis not present

## 2019-01-05 MED ORDER — LOSARTAN POTASSIUM 100 MG PO TABS: 100.0000 mg | ORAL_TABLET | Freq: Every day | ORAL | 1 refills | Status: DC

## 2019-01-05 MED ORDER — HYDROCORTISONE (PERIANAL) 2.5 % EX CREA: 1.0000 "application " | TOPICAL_CREAM | Freq: Two times a day (BID) | CUTANEOUS | 0 refills | Status: DC

## 2019-01-05 NOTE — Patient Instructions (Signed)

## 2019-01-05 NOTE — Progress Notes (Addendum)
Date:  01/05/2019   Name:  Erik Valdez   DOB:  05/14/1966   MRN:  [id number]   Chief Complaint: Annual Exam  Patient is a 53 year old male who presents for a comprehensive physical exam. The patient reports the following problems: hemorroids/internal noted on colonoscopy . Health maintenance has been reviewed up todate   Review of Systems  Constitutional: Negative for chills and fever.  HENT: Negative for drooling, ear discharge, ear pain and sore throat.   Respiratory: Negative for cough, chest tightness, shortness of breath and wheezing.   Cardiovascular: Negative for chest pain, palpitations and leg swelling.  Gastrointestinal: Negative for abdominal pain, blood in stool, constipation, diarrhea and nausea.  Endocrine: Negative for polydipsia.  Genitourinary: Negative for dysuria, frequency, hematuria and urgency.  Musculoskeletal: Negative for back pain, myalgias and neck pain.  Skin: Negative for rash.  Allergic/Immunologic: Negative for environmental allergies.  Neurological: Negative for dizziness and headaches.  Hematological: Does not bruise/bleed easily.  Psychiatric/Behavioral: Negative for suicidal ideas. The patient is not nervous/anxious.     Patient Active Problem List   Diagnosis Date Noted  . Pure hypercholesterolemia 10/13/2017  . External hemorrhoid 09/28/2016  . Mixed hyperlipidemia 09/28/2016  . Chronic seasonal allergic rhinitis due to pollen 09/28/2016  . Blood in stool   . Laboratory animal allergy 10/26/2014  . Acute back pain with sciatica 10/26/2014  . Routine general medical examination at a health care facility 10/26/2014  . Essential hypertension 10/26/2014  . Adult BMI 30+ 10/26/2014    No Known Allergies  Past Surgical History:  Procedure Laterality Date  . COLONOSCOPY WITH PROPOFOL N/A 01/16/2016   Procedure: COLONOSCOPY WITH PROPOFOL;  Surgeon: Lucilla Lame, MD;  Location: [place];  Service: Endoscopy;  Laterality: N/A;   . NASAL SINUS SURGERY     age 73 or 8    Social History   Tobacco Use  . Smoking status: Never Smoker  . Smokeless tobacco: Former Systems developer    Types: Chew  . Tobacco comment: Smokeless tobacco - occasionally (golfing, etc)  Substance Use Topics  . Alcohol use: Yes    Alcohol/week: 6.0 standard drinks    Types: 6 Cans of beer per week  . Drug use: No     Medication list has been reviewed and updated.  Current Meds  Medication Sig  . fluticasone (FLONASE) 50 MCG/ACT nasal spray Place 1 spray into both nostrils daily.  . hydrocortisone (ANUSOL-HC) 2.5 % rectal cream Place 1 application rectally 2 (two) times daily.  Marland Kitchen loratadine (CLARITIN) 10 MG tablet Take 1 tablet (10 mg total) by mouth daily.  Marland Kitchen losartan (COZAAR) 100 MG tablet Take 1 tablet (100 mg total) by mouth daily.    PHQ 2/9 Scores 01/05/2019 12/19/2015  PHQ - 2 Score 0 0    BP Readings from Last 3 Encounters:  01/05/19 120/80  07/07/18 110/80  01/04/18 110/80    Physical Exam Vitals signs and nursing note reviewed.  Constitutional:      Appearance: Normal appearance. He is well-developed. He is obese.  HENT:     Head: Normocephalic.     Jaw: There is normal jaw occlusion.     Right Ear: Hearing, tympanic membrane, ear canal and external ear normal.     Left Ear: Hearing, tympanic membrane, ear canal and external ear normal.     Nose: Nose normal.     Right Turbinates: Not enlarged.     Left Turbinates: Not enlarged.  Mouth/Throat:     Lips: Pink.     Mouth: Mucous membranes are moist.     Dentition: Normal dentition.     Tongue: No lesions.     Pharynx: Oropharynx is clear. Uvula midline.  Eyes:     General: Lids are normal. Vision grossly intact. Gaze aligned appropriately. No visual field deficit or scleral icterus.       Right eye: No discharge.        Left eye: No discharge.     Conjunctiva/sclera: Conjunctivae normal.     Pupils: Pupils are equal, round, and reactive to light.     Funduscopic  exam:    Right eye: No AV nicking.        Left eye: No AV nicking.  Neck:     Musculoskeletal: Full passive range of motion without pain, normal range of motion and neck supple.     Thyroid: No thyroid mass, thyromegaly or thyroid tenderness.     Vascular: Normal carotid pulses. No carotid bruit, hepatojugular reflux or JVD.     Trachea: Trachea and phonation normal. No tracheal deviation.  Cardiovascular:     Rate and Rhythm: Normal rate and regular rhythm.     Chest Wall: PMI is not displaced. No thrill.     Pulses: Normal pulses. No decreased pulses.          Carotid pulses are 2+ on the right side and 2+ on the left side.      Radial pulses are 2+ on the right side and 2+ on the left side.       Femoral pulses are 2+ on the right side and 2+ on the left side.      Popliteal pulses are 2+ on the right side and 2+ on the left side.       Dorsalis pedis pulses are 2+ on the right side and 2+ on the left side.       Posterior tibial pulses are 2+ on the right side and 2+ on the left side.     Heart sounds: Normal heart sounds, S1 normal and S2 normal. No murmur. No systolic murmur. No diastolic murmur. No friction rub. No gallop. No S3 or S4 sounds.   Pulmonary:     Effort: Pulmonary effort is normal. No respiratory distress.     Breath sounds: Normal breath sounds. No decreased air movement. No decreased breath sounds, wheezing, rhonchi or rales.  Chest:     Chest wall: No mass.     Breasts:        Right: Normal. No swelling, bleeding, inverted nipple, mass, nipple discharge, skin change or tenderness.        Left: Normal. No swelling, bleeding, inverted nipple, mass, nipple discharge, skin change or tenderness.  Abdominal:     General: Bowel sounds are normal.     Palpations: Abdomen is soft. There is no hepatomegaly, splenomegaly or mass.     Tenderness: There is no abdominal tenderness. There is no guarding or rebound.     Hernia: No hernia is present. There is no hernia in the  left inguinal area or right inguinal area.  Genitourinary:    Penis: Normal.      Scrotum/Testes: Normal.        Right: Mass not present.        Left: Mass not present.     Epididymis:     Right: Normal.     Left: Normal.     Prostate: Normal. Not  enlarged, not tender and no nodules present.     Rectum: Normal. Guaiac result negative. No mass.  Musculoskeletal: Normal range of motion.        General: No tenderness.     Lumbar back: Normal.     Right lower leg: No edema.     Left lower leg: No edema.  Lymphadenopathy:     Head:     Right side of head: No submandibular adenopathy.     Left side of head: No submandibular adenopathy.     Cervical: No cervical adenopathy.     Right cervical: No superficial, deep or posterior cervical adenopathy.    Left cervical: No superficial, deep or posterior cervical adenopathy.     Upper Body:     Right upper body: No supraclavicular, axillary or pectoral adenopathy.     Left upper body: No supraclavicular, axillary or pectoral adenopathy.     Lower Body: No right inguinal adenopathy. No left inguinal adenopathy.  Skin:    General: Skin is warm.     Capillary Refill: Capillary refill takes less than 2 seconds.     Findings: No rash.  Neurological:     General: No focal deficit present.     Mental Status: He is alert and oriented to person, place, and time.     Cranial Nerves: Cranial nerves are intact. No cranial nerve deficit.     Sensory: Sensation is intact. No sensory deficit.     Motor: Motor function is intact.     Deep Tendon Reflexes: Reflexes are normal and symmetric.     Reflex Scores:      Tricep reflexes are 2+ on the right side and 2+ on the left side.      Bicep reflexes are 2+ on the right side and 2+ on the left side.      Brachioradialis reflexes are 2+ on the right side and 2+ on the left side.      Patellar reflexes are 2+ on the right side and 2+ on the left side.      Achilles reflexes are 2+ on the right side and 2+  on the left side. Psychiatric:        Behavior: Behavior is cooperative.     Wt Readings from Last 3 Encounters:  01/05/19 254 lb (115.2 kg)  07/07/18 274 lb (124.3 kg)  01/04/18 263 lb (119.3 kg)    BP 120/80   Pulse 60   Ht 6' (1.829 m)   Wt 254 lb (115.2 kg)   BMI 34.45 kg/m   Assessment and Plan: 1. Annual physical exam No subjective/objective concerns on history physical exam.  Patient's chart was reviewed for previous encounters, labs, imaging.  Will check comprehensive metabolic panel and lipid panel. Rudene ReSteven M Hantz is a 53 y.o. male who presents today for his Complete Annual Exam. He feels well. He reports exercising . He reports he is sleeping well. Rudene ReSteven M Hernandes is a 53 y.o. male who presents today for his Complete Annual Exam.Immunizations are reviewed and recommendations provided.   Age appropriate screening tests are discussed. Counseling given for risk factor reduction interventions.Health risks of being over weight were discussed and patient was counseled on weight loss options and exercise. - Comprehensive metabolic panel - Lipid panel  2. Nocturia Patient has occasional nocturia for which we will check a PSA for possible prostatic concern. - PSA  3. Essential hypertension Chronic.  Controlled.  Continue losartan 100 mg once a day.  Will check  renal panel. - losartan (COZAAR) 100 MG tablet; Take 1 tablet (100 mg total) by mouth daily.  Dispense: 90 tablet; Refill: 1

## 2019-01-06 ENCOUNTER — Encounter: Payer: 59 | Admitting: Family Medicine

## 2019-01-06 LAB — COMPREHENSIVE METABOLIC PANEL
ALT: 39 IU/L (ref 0–44)
AST: 22 IU/L (ref 0–40)
Albumin/Globulin Ratio: 1.8 (ref 1.2–2.2)
Albumin: 4.3 g/dL (ref 3.8–4.9)
Alkaline Phosphatase: 90 IU/L (ref 39–117)
BUN/Creatinine Ratio: 11 (ref 9–20)
BUN: 11 mg/dL (ref 6–24)
Bilirubin Total: 0.6 mg/dL (ref 0.0–1.2)
CO2: 21 mmol/L (ref 20–29)
Calcium: 9.7 mg/dL (ref 8.7–10.2)
Chloride: 102 mmol/L (ref 96–106)
Creatinine, Ser: 0.96 mg/dL (ref 0.76–1.27)
GFR calc Af Amer: 105 mL/min/{1.73_m2} (ref >59)
GFR calc non Af Amer: 91 mL/min/{1.73_m2} (ref >59)
Globulin, Total: 2.4 g/dL (ref 1.5–4.5)
Glucose: 94 mg/dL (ref 65–99)
Potassium: 4.4 mmol/L (ref 3.5–5.2)
Sodium: 138 mmol/L (ref 134–144)
Total Protein: 6.7 g/dL (ref 6.0–8.5)

## 2019-01-06 LAB — PSA: Prostate Specific Ag, Serum: 0.3 ng/mL (ref 0.0–4.0)

## 2019-01-06 LAB — LIPID PANEL
Chol/HDL Ratio: 5.1 ratio — ABNORMAL HIGH (ref 0.0–5.0)
Cholesterol, Total: 182 mg/dL (ref 100–199)
HDL: 36 mg/dL — ABNORMAL LOW (ref >39)
LDL Calculated: 123 mg/dL — ABNORMAL HIGH (ref 0–99)
Triglycerides: 115 mg/dL (ref 0–149)
VLDL Cholesterol Cal: 23 mg/dL (ref 5–40)

## 2019-06-27 ENCOUNTER — Encounter: Payer: Self-pay | Admitting: Family Medicine

## 2019-06-27 ENCOUNTER — Ambulatory Visit (INDEPENDENT_AMBULATORY_CARE_PROVIDER_SITE_OTHER): Payer: 59 | Admitting: Family Medicine

## 2019-06-27 ENCOUNTER — Other Ambulatory Visit: Payer: Self-pay

## 2019-06-27 VITALS — BP 132/80 | HR 84 | Ht 72.0 in | Wt 259.0 lb

## 2019-06-27 DIAGNOSIS — F419 Anxiety disorder, unspecified: Secondary | ICD-10-CM | POA: Diagnosis not present

## 2019-06-27 DIAGNOSIS — I10 Essential (primary) hypertension: Secondary | ICD-10-CM | POA: Diagnosis not present

## 2019-06-27 DIAGNOSIS — F329 Major depressive disorder, single episode, unspecified: Secondary | ICD-10-CM

## 2019-06-27 MED ORDER — SERTRALINE HCL 50 MG PO TABS: 50.0000 mg | ORAL_TABLET | Freq: Every day | ORAL | 3 refills | Status: DC

## 2019-06-27 MED ORDER — PROPRANOLOL HCL 10 MG PO TABS: 10.0000 mg | ORAL_TABLET | Freq: Two times a day (BID) | ORAL | 5 refills | Status: DC

## 2019-06-27 NOTE — Progress Notes (Signed)
Date:  06/27/2019   Name:  Erik Valdez   DOB:  01/14/1966   MRN:  [id number]   Chief Complaint: Anxiety (PHQ9=19 and GAD7=21- has been "building over time," triggered by conference calls)  Depression        This is a recurrent problem.  The current episode started more than 1 month ago.   The onset quality is gradual.   The problem occurs daily.  The problem has been gradually worsening since onset.  Associated symptoms include decreased concentration, fatigue, helplessness, hopelessness, insomnia, irritable, restlessness, decreased interest and sad.  Associated symptoms include no appetite change, no body aches, no myalgias, no headaches, no indigestion and no suicidal ideas.     The symptoms are aggravated by work stress.  Past treatments include SSRIs - Selective serotonin reuptake inhibitors.  Compliance with treatment is good.  Previous treatment provided mild relief.  Past medical history includes anxiety.   Anxiety Presents for follow-up visit. Symptoms include decreased concentration, depressed mood, excessive worry, insomnia, irritability, nervous/anxious behavior and restlessness. Patient reports no chest pain, dizziness, malaise, nausea, palpitations, shortness of breath or suicidal ideas.      Lab Results  Component Value Date   CREATININE 0.96 01/05/2019   BUN 11 01/05/2019   NA 138 01/05/2019   K 4.4 01/05/2019   CL 102 01/05/2019   CO2 21 01/05/2019   Lab Results  Component Value Date   CHOL 182 01/05/2019   HDL 36 (L) 01/05/2019   LDLCALC 123 (H) 01/05/2019   TRIG 115 01/05/2019   CHOLHDL 5.1 (H) 01/05/2019   No results found for: TSH No results found for: HGBA1C   Review of Systems  Constitutional: Positive for fatigue and irritability. Negative for appetite change, chills and fever.  HENT: Negative for drooling, ear discharge, ear pain and sore throat.   Respiratory: Negative for cough, shortness of breath and wheezing.   Cardiovascular: Negative for  chest pain, palpitations and leg swelling.  Gastrointestinal: Negative for abdominal pain, blood in stool, constipation, diarrhea and nausea.  Endocrine: Negative for polydipsia.  Genitourinary: Negative for dysuria, frequency, hematuria and urgency.  Musculoskeletal: Negative for back pain, myalgias and neck pain.  Skin: Negative for rash.  Allergic/Immunologic: Negative for environmental allergies.  Neurological: Negative for dizziness and headaches.  Hematological: Does not bruise/bleed easily.  Psychiatric/Behavioral: Positive for decreased concentration and depression. Negative for suicidal ideas. The patient is nervous/anxious and has insomnia.     Patient Active Problem List   Diagnosis Date Noted  . Pure hypercholesterolemia 10/13/2017  . External hemorrhoid 09/28/2016  . Mixed hyperlipidemia 09/28/2016  . Chronic seasonal allergic rhinitis due to pollen 09/28/2016  . Blood in stool   . Laboratory animal allergy 10/26/2014  . Acute back pain with sciatica 10/26/2014  . Routine general medical examination at a health care facility 10/26/2014  . Essential hypertension 10/26/2014  . Adult BMI 30+ 10/26/2014    No Known Allergies  Past Surgical History:  Procedure Laterality Date  . COLONOSCOPY WITH PROPOFOL N/A 01/16/2016   Procedure: COLONOSCOPY WITH PROPOFOL;  Surgeon: Lucilla Lame, MD;  Location: [place];  Service: Endoscopy;  Laterality: N/A;  . NASAL SINUS SURGERY     age 25 or 8    Social History   Tobacco Use  . Smoking status: Never Smoker  . Smokeless tobacco: Former Systems developer    Types: Chew  . Tobacco comment: Smokeless tobacco - occasionally (golfing, etc)  Substance Use Topics  . Alcohol use:  Yes    Alcohol/week: 6.0 standard drinks    Types: 6 Cans of beer per week  . Drug use: No     Medication list has been reviewed and updated.  Current Meds  Medication Sig  . fluticasone (FLONASE) 50 MCG/ACT nasal spray Place 1 spray into both nostrils  daily.  . hydrocortisone (ANUSOL-HC) 2.5 % rectal cream Place 1 application rectally 2 (two) times daily.  Marland Kitchen loratadine (CLARITIN) 10 MG tablet Take 1 tablet (10 mg total) by mouth daily.  Marland Kitchen losartan (COZAAR) 100 MG tablet Take 1 tablet (100 mg total) by mouth daily.    PHQ 2/9 Scores 06/27/2019 01/05/2019 12/19/2015  PHQ - 2 Score 3 0 0  PHQ- 9 Score 19 - -    BP Readings from Last 3 Encounters:  06/27/19 132/80  01/05/19 120/80  07/07/18 110/80    Physical Exam Vitals and nursing note reviewed.  Constitutional:      General: He is irritable.  HENT:     Head: Normocephalic.     Right Ear: Tympanic membrane, ear canal and external ear normal.     Left Ear: Tympanic membrane, ear canal and external ear normal.     Nose: Nose normal. No congestion or rhinorrhea.     Mouth/Throat:     Mouth: Mucous membranes are moist.  Eyes:     General: No scleral icterus.       Right eye: No discharge.        Left eye: No discharge.     Conjunctiva/sclera: Conjunctivae normal.     Pupils: Pupils are equal, round, and reactive to light.  Neck:     Thyroid: No thyromegaly.     Vascular: No JVD.     Trachea: No tracheal deviation.  Cardiovascular:     Rate and Rhythm: Normal rate and regular rhythm.     Heart sounds: Normal heart sounds. No murmur. No friction rub. No gallop.   Pulmonary:     Effort: No respiratory distress.     Breath sounds: Normal breath sounds. No wheezing or rales.  Abdominal:     General: Bowel sounds are normal.     Palpations: Abdomen is soft. There is no mass.     Tenderness: There is no abdominal tenderness. There is no guarding or rebound.  Musculoskeletal:        General: No tenderness. Normal range of motion.     Cervical back: Normal range of motion and neck supple.  Lymphadenopathy:     Cervical: No cervical adenopathy.  Skin:    General: Skin is warm.     Findings: No rash.  Neurological:     Mental Status: He is alert and oriented to person, place,  and time.     Cranial Nerves: No cranial nerve deficit.     Deep Tendon Reflexes: Reflexes are normal and symmetric.     Wt Readings from Last 3 Encounters:  06/27/19 259 lb (117.5 kg)  01/05/19 254 lb (115.2 kg)  07/07/18 274 lb (124.3 kg)    BP 132/80   Pulse 84   Ht 6' (1.829 m)   Wt 259 lb (117.5 kg)   BMI 35.13 kg/m   Assessment and Plan: 1. Reactive depression Recurrent.  Active.  Uncontrolled.  PHQ level is 19 patient used to be on sertraline in the past and this gradually came under control but because of the recent stress levels of Covid, and  work load.  Will resume sertraline at 750 mg  once a day and will recheck in 3 to 4 weeks. - sertraline (ZOLOFT) 50 MG tablet; Take 1 tablet (50 mg total) by mouth daily.  Dispense: 30 tablet; Refill: 3  2. Anxiety Recurrent.  Active.  Uncontrolled.  Patient sounds like he was on propranolol in the past because there is some anxiety when he has to present at meetings.  Patient was previously believed to been put on propranolol and we will initiate at 10 mg 1 tablet twice a day as needed prior to circumstances that require presentations or oral presence.  We will leave reevaluate in 2 to 3 weeks. - propranolol (INDERAL) 10 MG tablet; Take 1 tablet (10 mg total) by mouth 2 (two) times daily.  Dispense: 60 tablet; Refill: 5  3. Essential hypertension Chronic.  Controlled.  Stable.  Patient blood pressure despite the anxiety and depression concerns is stable today.  We will initiate propranolol at a suboptimal dosing for blood pressure control but keep in mind that we may need to make this extremely. - propranolol (INDERAL) 10 MG tablet; Take 1 tablet (10 mg total) by mouth 2 (two) times daily.  Dispense: 60 tablet; Refill: 5

## 2019-07-13 ENCOUNTER — Encounter: Payer: Self-pay | Admitting: Family Medicine

## 2019-07-13 ENCOUNTER — Other Ambulatory Visit: Payer: Self-pay

## 2019-07-13 ENCOUNTER — Ambulatory Visit (INDEPENDENT_AMBULATORY_CARE_PROVIDER_SITE_OTHER): Payer: 59 | Admitting: Family Medicine

## 2019-07-13 VITALS — BP 130/98 | HR 64 | Ht 72.0 in | Wt 253.0 lb

## 2019-07-13 DIAGNOSIS — F419 Anxiety disorder, unspecified: Secondary | ICD-10-CM

## 2019-07-13 DIAGNOSIS — I10 Essential (primary) hypertension: Secondary | ICD-10-CM | POA: Diagnosis not present

## 2019-07-13 DIAGNOSIS — E782 Mixed hyperlipidemia: Secondary | ICD-10-CM | POA: Diagnosis not present

## 2019-07-13 DIAGNOSIS — F329 Major depressive disorder, single episode, unspecified: Secondary | ICD-10-CM | POA: Diagnosis not present

## 2019-07-13 DIAGNOSIS — J301 Allergic rhinitis due to pollen: Secondary | ICD-10-CM | POA: Diagnosis not present

## 2019-07-13 MED ORDER — LORATADINE 10 MG PO TABS: 10.0000 mg | ORAL_TABLET | Freq: Every day | ORAL | 1 refills | Status: DC

## 2019-07-13 MED ORDER — HYDROCHLOROTHIAZIDE 12.5 MG PO CAPS: 12.5000 mg | ORAL_CAPSULE | Freq: Every day | ORAL | 1 refills | Status: DC

## 2019-07-13 MED ORDER — HYDROCORTISONE (PERIANAL) 2.5 % EX CREA: 1.0000 "application " | TOPICAL_CREAM | Freq: Two times a day (BID) | CUTANEOUS | 0 refills | Status: DC

## 2019-07-13 MED ORDER — LOSARTAN POTASSIUM 100 MG PO TABS: 100.0000 mg | ORAL_TABLET | Freq: Every day | ORAL | 1 refills | Status: DC

## 2019-07-13 MED ORDER — SERTRALINE HCL 50 MG PO TABS: 50.0000 mg | ORAL_TABLET | Freq: Every day | ORAL | 1 refills | Status: DC

## 2019-07-13 MED ORDER — FLUTICASONE PROPIONATE 50 MCG/ACT NA SUSP: 1.0000 | Freq: Every day | NASAL | 11 refills | Status: DC

## 2019-07-13 NOTE — Progress Notes (Signed)
Date:  07/13/2019   Name:  Erik Valdez   DOB:  May 04, 1966   MRN:  [id number]   Chief Complaint: Depression (PHQ9=2 and GAD7=8- improved but not quite where we want), Allergic Rhinitis , Hemorrhoids, and Hypertension  Depression        This is a recurrent problem.  The current episode started more than 1 month ago.   The onset quality is gradual.   The problem occurs every several days.  The problem has been gradually improving since onset.  Associated symptoms include irritable, restlessness and decreased interest.  Associated symptoms include no decreased concentration, no fatigue, no helplessness, no hopelessness, does not have insomnia, no appetite change, no body aches, no myalgias, no headaches, no indigestion, not sad and no suicidal ideas.     The symptoms are aggravated by work stress.  Past treatments include SSRIs - Selective serotonin reuptake inhibitors.  Compliance with treatment is good.  Previous treatment provided moderate relief.  Past medical history includes anxiety.   Hypertension This is a chronic problem. The current episode started more than 1 year ago. The problem has been waxing and waning since onset. Associated symptoms include anxiety. Pertinent negatives include no blurred vision, chest pain, headaches, malaise/fatigue, neck pain, orthopnea, palpitations, peripheral edema, PND, shortness of breath or sweats. There are no associated agents to hypertension. Risk factors for coronary artery disease include post-menopausal state. Past treatments include angiotensin blockers. The current treatment provides moderate improvement. There are no compliance problems.  There is no history of angina, CAD/MI, CVA or left ventricular hypertrophy. There is no history of chronic renal disease, a hypertension causing med or renovascular disease.  Anxiety Presents for follow-up visit. Symptoms include excessive worry, irritability, nervous/anxious behavior and restlessness. Patient  reports no chest pain, confusion, decreased concentration, dizziness, insomnia, nausea, palpitations, shortness of breath or suicidal ideas. Symptoms occur occasionally. The severity of symptoms is moderate.    Hyperlipidemia This is a chronic problem. The current episode started more than 1 month ago. Recent lipid tests were reviewed and are variable. He has no history of chronic renal disease. Factors aggravating his hyperlipidemia include fatty foods. Pertinent negatives include no chest pain, focal sensory loss, myalgias or shortness of breath. Current antihyperlipidemic treatment includes diet change.    Lab Results  Component Value Date   CREATININE 0.96 01/05/2019   BUN 11 01/05/2019   NA 138 01/05/2019   K 4.4 01/05/2019   CL 102 01/05/2019   CO2 21 01/05/2019   Lab Results  Component Value Date   CHOL 182 01/05/2019   HDL 36 (L) 01/05/2019   LDLCALC 123 (H) 01/05/2019   TRIG 115 01/05/2019   CHOLHDL 5.1 (H) 01/05/2019   No results found for: TSH No results found for: HGBA1C   Review of Systems  Constitutional: Positive for irritability. Negative for appetite change, chills, fatigue, fever and malaise/fatigue.  HENT: Negative for drooling, ear discharge, ear pain and sore throat.   Eyes: Negative for blurred vision.  Respiratory: Negative for cough, shortness of breath and wheezing.   Cardiovascular: Negative for chest pain, palpitations, orthopnea, leg swelling and PND.  Gastrointestinal: Negative for abdominal pain, blood in stool, constipation, diarrhea and nausea.  Endocrine: Negative for polydipsia.  Genitourinary: Negative for dysuria, frequency, hematuria and urgency.  Musculoskeletal: Negative for back pain, myalgias and neck pain.  Skin: Negative for rash.  Allergic/Immunologic: Negative for environmental allergies.  Neurological: Negative for dizziness and headaches.  Hematological: Does not bruise/bleed easily.  Psychiatric/Behavioral: Positive for  depression. Negative for confusion, decreased concentration and suicidal ideas. The patient is nervous/anxious. The patient does not have insomnia.     Patient Active Problem List   Diagnosis Date Noted  . Pure hypercholesterolemia 10/13/2017  . External hemorrhoid 09/28/2016  . Mixed hyperlipidemia 09/28/2016  . Chronic seasonal allergic rhinitis due to pollen 09/28/2016  . Blood in stool   . Laboratory animal allergy 10/26/2014  . Acute back pain with sciatica 10/26/2014  . Routine general medical examination at a health care facility 10/26/2014  . Essential hypertension 10/26/2014  . Adult BMI 30+ 10/26/2014    No Known Allergies  Past Surgical History:  Procedure Laterality Date  . COLONOSCOPY WITH PROPOFOL N/A 01/16/2016   Procedure: COLONOSCOPY WITH PROPOFOL;  Surgeon: Midge Minium, MD;  Location: [facility];  Service: Endoscopy;  Laterality: N/A;  . NASAL SINUS SURGERY     age 54 or 8    Social History   Tobacco Use  . Smoking status: Never Smoker  . Smokeless tobacco: Former Neurosurgeon    Types: Chew  . Tobacco comment: Smokeless tobacco - occasionally (golfing, etc)  Substance Use Topics  . Alcohol use: Yes    Alcohol/week: 6.0 standard drinks    Types: 6 Cans of beer per week  . Drug use: No     Medication list has been reviewed and updated.  Current Meds  Medication Sig  . fluticasone (FLONASE) 50 MCG/ACT nasal spray Place 1 spray into both nostrils daily.  . hydrocortisone (ANUSOL-HC) 2.5 % rectal cream Place 1 application rectally 2 (two) times daily.  Marland Kitchen loratadine (CLARITIN) 10 MG tablet Take 1 tablet (10 mg total) by mouth daily.  Marland Kitchen losartan (COZAAR) 100 MG tablet Take 1 tablet (100 mg total) by mouth daily.  . propranolol (INDERAL) 10 MG tablet Take 1 tablet (10 mg total) by mouth 2 (two) times daily.  . sertraline (ZOLOFT) 50 MG tablet Take 1 tablet (50 mg total) by mouth daily.    PHQ 2/9 Scores 07/13/2019 06/27/2019 01/05/2019 12/19/2015  PHQ - 2  Score 0 3 0 0  PHQ- 9 Score 2 19 - -    BP Readings from Last 3 Encounters:  07/13/19 (!) 130/98  06/27/19 132/80  01/05/19 120/80    Physical Exam Vitals and nursing note reviewed.  Constitutional:      General: He is irritable.  HENT:     Head: Normocephalic.     Right Ear: Tympanic membrane, ear canal and external ear normal.     Left Ear: Tympanic membrane, ear canal and external ear normal.     Nose: Nose normal. No congestion or rhinorrhea.  Eyes:     General: No scleral icterus.       Right eye: No discharge.        Left eye: No discharge.     Conjunctiva/sclera: Conjunctivae normal.     Pupils: Pupils are equal, round, and reactive to light.  Neck:     Thyroid: No thyromegaly.     Vascular: No JVD.     Trachea: No tracheal deviation.  Cardiovascular:     Rate and Rhythm: Normal rate and regular rhythm.     Heart sounds: Normal heart sounds. No murmur. No friction rub. No gallop.   Pulmonary:     Effort: No respiratory distress.     Breath sounds: Normal breath sounds. No wheezing or rales.  Abdominal:     General: Bowel sounds are normal.  Palpations: Abdomen is soft. There is no mass.     Tenderness: There is no abdominal tenderness. There is no guarding or rebound.  Musculoskeletal:        General: No tenderness. Normal range of motion.     Cervical back: Normal range of motion and neck supple.  Lymphadenopathy:     Cervical: No cervical adenopathy.  Skin:    General: Skin is warm.     Findings: No rash.  Neurological:     Mental Status: He is alert and oriented to person, place, and time.     Cranial Nerves: No cranial nerve deficit.     Deep Tendon Reflexes: Reflexes are normal and symmetric.     Wt Readings from Last 3 Encounters:  07/13/19 253 lb (114.8 kg)  06/27/19 259 lb (117.5 kg)  01/05/19 254 lb (115.2 kg)    BP (!) 130/98   Pulse 64   Ht 6' (1.829 m)   Wt 253 lb (114.8 kg)   BMI 34.31 kg/m   Assessment and Plan:  1.  Essential hypertension Chronic.  Controlled.  Stable.  Continue losartan 100 mg once a day.  Will check renal panel for assessment of GFR. - losartan (COZAAR) 100 MG tablet; Take 1 tablet (100 mg total) by mouth daily.  Dispense: 90 tablet; Refill: 1 - Renal Function Panel  2. Chronic seasonal allergic rhinitis due to pollen Chronic.  Controlled.  Seasonal.  Continue fluticasone and loratadine 10 mg once a day. - fluticasone (FLONASE) 50 MCG/ACT nasal spray; Place 1 spray into both nostrils daily. 3 bottle at a visit  Dispense: 16 g; Refill: 11 - loratadine (CLARITIN) 10 MG tablet; Take 1 tablet (10 mg total) by mouth daily.  Dispense: 90 tablet; Refill: 1  3. Reactive depression Chronic.  Controlled.  Stable.  PHQ went from 22-2.  Continue sertraline at 50 mg once a day. - sertraline (ZOLOFT) 50 MG tablet; Take 1 tablet (50 mg total) by mouth daily.  Dispense: 90 tablet; Refill: 1  4. Mixed hyperlipidemia Chronic.  Controlled.  LDL is elevated on the past several readings in the 1 20-1 30 range.  Patient has been given low-cholesterol sheets in the past we will check a lipid panel and make a decision on whether to instigate a statin plan. - Lipid Panel With LDL/HDL Ratio  5. Anxiety Chronic.  Controlled.  Stable.  Gad score went from 18-8.  Will continue sertraline 50 mg once a day and will recheck in 6 months.

## 2019-07-14 ENCOUNTER — Other Ambulatory Visit: Payer: Self-pay

## 2019-07-14 DIAGNOSIS — E782 Mixed hyperlipidemia: Secondary | ICD-10-CM

## 2019-07-14 LAB — RENAL FUNCTION PANEL
Albumin: 4.2 g/dL (ref 3.8–4.9)
BUN/Creatinine Ratio: 13 (ref 9–20)
BUN: 12 mg/dL (ref 6–24)
CO2: 23 mmol/L (ref 20–29)
Calcium: 9.8 mg/dL (ref 8.7–10.2)
Chloride: 103 mmol/L (ref 96–106)
Creatinine, Ser: 0.93 mg/dL (ref 0.76–1.27)
GFR calc Af Amer: 109 mL/min/{1.73_m2} (ref >59)
GFR calc non Af Amer: 95 mL/min/{1.73_m2} (ref >59)
Glucose: 113 mg/dL — ABNORMAL HIGH (ref 65–99)
Phosphorus: 3.8 mg/dL (ref 2.8–4.1)
Potassium: 5.1 mmol/L (ref 3.5–5.2)
Sodium: 138 mmol/L (ref 134–144)

## 2019-07-14 LAB — LIPID PANEL WITH LDL/HDL RATIO
Cholesterol, Total: 205 mg/dL — ABNORMAL HIGH (ref 100–199)
HDL: 42 mg/dL (ref >39)
LDL Chol Calc (NIH): 145 mg/dL — ABNORMAL HIGH (ref 0–99)
LDL/HDL Ratio: 3.5 ratio (ref 0.0–3.6)
Triglycerides: 102 mg/dL (ref 0–149)
VLDL Cholesterol Cal: 18 mg/dL (ref 5–40)

## 2019-07-14 MED ORDER — EZETIMIBE 10 MG PO TABS: 10.0000 mg | ORAL_TABLET | Freq: Every day | ORAL | 1 refills | Status: DC

## 2019-07-14 NOTE — Progress Notes (Unsigned)
Sent in [facility]

## 2019-12-14 ENCOUNTER — Other Ambulatory Visit: Payer: Self-pay | Admitting: Family Medicine

## 2019-12-14 DIAGNOSIS — F329 Major depressive disorder, single episode, unspecified: Secondary | ICD-10-CM

## 2019-12-22 ENCOUNTER — Other Ambulatory Visit: Payer: Self-pay | Admitting: Family Medicine

## 2020-01-08 ENCOUNTER — Other Ambulatory Visit: Payer: Self-pay | Admitting: Family Medicine

## 2020-01-08 DIAGNOSIS — I10 Essential (primary) hypertension: Secondary | ICD-10-CM

## 2020-01-08 NOTE — Telephone Encounter (Signed)
Requested Prescriptions  Pending Prescriptions Disp Refills   losartan (COZAAR) 100 MG tablet [Pharmacy Med Name: LOSARTAN  100MG   TAB] 90 tablet 0    Sig: TAKE 1 TABLET BY MOUTH  DAILY     Cardiovascular:  Angiotensin Receptor Blockers Failed - 01/08/2020  9:28 PM      Failed - Last BP in normal range    BP Readings from Last 1 Encounters:  07/13/19 (!) 130/98         Passed - Cr in normal range and within 180 days    Creatinine, Ser  Date Value Ref Range Status  07/13/2019 0.93 0.76 - 1.27 mg/dL Final         Passed - K in normal range and within 180 days    Potassium  Date Value Ref Range Status  07/13/2019 5.1 3.5 - 5.2 mmol/L Final         Passed - Patient is not pregnant      Passed - Valid encounter within last 6 months    Recent Outpatient Visits          5 months ago Essential hypertension   [facility] 07/15/2019, MD   6 months ago Reactive depression   [facility] Duanne Limerick, MD   1 year ago Annual physical exam   [facility], [facility]   1 year ago Essential hypertension   [facility] Duanne Limerick, MD   2 years ago Essential hypertension   [facility], MD             Reminder for patient to schedule month follow-up appointment with provider.

## 2020-01-18 ENCOUNTER — Encounter: Payer: Self-pay | Admitting: Family Medicine

## 2020-01-18 ENCOUNTER — Telehealth: Payer: Self-pay | Admitting: Family Medicine

## 2020-01-18 ENCOUNTER — Ambulatory Visit (INDEPENDENT_AMBULATORY_CARE_PROVIDER_SITE_OTHER): Payer: 59 | Admitting: Family Medicine

## 2020-01-18 ENCOUNTER — Other Ambulatory Visit: Payer: Self-pay

## 2020-01-18 VITALS — BP 122/78 | HR 77 | Temp 98.1°F | Ht 72.0 in | Wt 266.0 lb

## 2020-01-18 DIAGNOSIS — J0100 Acute maxillary sinusitis, unspecified: Secondary | ICD-10-CM | POA: Diagnosis not present

## 2020-01-18 MED ORDER — AMOXICILLIN-POT CLAVULANATE 875-125 MG PO TABS: 1.0000 | ORAL_TABLET | Freq: Two times a day (BID) | ORAL | 0 refills | Status: DC

## 2020-01-18 NOTE — Progress Notes (Signed)
Date:  01/18/2020   Name:  Erik Valdez   DOB:  [birth year]   MRN:  [id number]   Chief Complaint: Sinusitis (X2-3 days cough, runny nose, no fever now, watery eyes )  Sinusitis This is a chronic problem. The current episode started more than 1 year ago. The problem has been gradually worsening since onset. Maximum temperature: 99.5/this AM. The pain is mild. Associated symptoms include chills, congestion, coughing, headaches, sinus pressure and a sore throat. Pertinent negatives include no diaphoresis, ear pain, hoarse voice, neck pain, shortness of breath, sneezing or swollen glands. (Postnasal drainage/ ) Past treatments include oral decongestants and spray decongestants (Flonase). The treatment provided no relief.    Lab Results  Component Value Date   CREATININE 0.93 07/13/2019   BUN 12 07/13/2019   NA 138 07/13/2019   K 5.1 07/13/2019   CL 103 07/13/2019   CO2 23 07/13/2019   Lab Results  Component Value Date   CHOL 205 (H) 07/13/2019   HDL 42 07/13/2019   LDLCALC 145 (H) 07/13/2019   TRIG 102 07/13/2019   CHOLHDL 5.1 (H) 01/05/2019   No results found for: TSH No results found for: HGBA1C No results found for: WBC, HGB, HCT, MCV, PLT Lab Results  Component Value Date   ALT 39 01/05/2019   AST 22 01/05/2019   ALKPHOS 90 01/05/2019   BILITOT 0.6 01/05/2019     Review of Systems  Constitutional: Positive for chills. Negative for diaphoresis and fever.  HENT: Positive for congestion, sinus pressure and sore throat. Negative for drooling, ear discharge, ear pain, hoarse voice and sneezing.   Respiratory: Positive for cough. Negative for shortness of breath and wheezing.   Cardiovascular: Negative for chest pain, palpitations and leg swelling.  Gastrointestinal: Negative for abdominal pain, blood in stool, constipation, diarrhea and nausea.  Endocrine: Negative for polydipsia.  Genitourinary: Negative for dysuria, frequency, hematuria and urgency.  Musculoskeletal:  Negative for back pain, myalgias and neck pain.  Skin: Negative for rash.  Allergic/Immunologic: Negative for environmental allergies.  Neurological: Positive for headaches. Negative for dizziness.  Hematological: Does not bruise/bleed easily.  Psychiatric/Behavioral: Negative for suicidal ideas. The patient is not nervous/anxious.     Patient Active Problem List   Diagnosis Date Noted  . Reactive depression 07/13/2019  . Anxiety 07/13/2019  . Pure hypercholesterolemia 10/13/2017  . External hemorrhoid 09/28/2016  . Mixed hyperlipidemia 09/28/2016  . Chronic seasonal allergic rhinitis due to pollen 09/28/2016  . Blood in stool   . Laboratory animal allergy 10/26/2014  . Acute back pain with sciatica 10/26/2014  . Routine general medical examination at a health care facility 10/26/2014  . Essential hypertension 10/26/2014  . Adult BMI 30+ 10/26/2014    No Known Allergies  Past Surgical History:  Procedure Laterality Date  . COLONOSCOPY WITH PROPOFOL N/A 01/16/2016   Procedure: COLONOSCOPY WITH PROPOFOL;  Surgeon: Midge Minium, MD;  Location: [facility];  Service: Endoscopy;  Laterality: N/A;  . NASAL SINUS SURGERY     age 57 or 8    Social History   Tobacco Use  . Smoking status: Never Smoker  . Smokeless tobacco: Former Neurosurgeon    Types: Chew  . Tobacco comment: Smokeless tobacco - occasionally (golfing, etc)  Substance Use Topics  . Alcohol use: Yes    Alcohol/week: 6.0 standard drinks    Types: 6 Cans of beer per week  . Drug use: No     Medication list has been reviewed and updated.  Current Meds  Medication Sig  . ezetimibe (ZETIA) 10 MG tablet Take 1 tablet (10 mg total) by mouth daily.  . fluticasone (FLONASE) 50 MCG/ACT nasal spray Place 1 spray into both nostrils daily. 3 bottle at a visit  . hydrochlorothiazide (MICROZIDE) 12.5 MG capsule TAKE 1 CAPSULE BY MOUTH  DAILY  . hydrocortisone (ANUSOL-HC) 2.5 % rectal cream Place 1 application rectally 2  (two) times daily.  Marland Kitchen loratadine (CLARITIN) 10 MG tablet Take 1 tablet (10 mg total) by mouth daily.  Marland Kitchen losartan (COZAAR) 100 MG tablet TAKE 1 TABLET BY MOUTH  DAILY  . sertraline (ZOLOFT) 50 MG tablet TAKE 1 TABLET BY MOUTH  DAILY    PHQ 2/9 Scores 01/18/2020 07/13/2019 06/27/2019 01/05/2019  PHQ - 2 Score 0 0 3 0  PHQ- 9 Score 0 2 19 -    GAD 7 : Generalized Anxiety Score 01/18/2020 07/13/2019 06/27/2019  Nervous, Anxious, on Edge 0 0 3  Control/stop worrying 0 2 3  Worry too much - different things 0 3 3  Trouble relaxing 0 1 3  Restless 0 0 3  Easily annoyed or irritable 0 1 3  Afraid - awful might happen 0 1 3  Total GAD 7 Score 0 8 21  Anxiety Difficulty Not difficult at all Not difficult at all Somewhat difficult    BP Readings from Last 3 Encounters:  01/18/20 122/78  07/13/19 (!) 130/98  06/27/19 132/80    Physical Exam Vitals and nursing note reviewed.  HENT:     Head: Normocephalic.     Jaw: There is normal jaw occlusion.     Right Ear: Tympanic membrane, ear canal and external ear normal.     Left Ear: Tympanic membrane, ear canal and external ear normal.     Nose: Nose normal. No congestion or rhinorrhea.     Mouth/Throat:     Lips: Pink.     Mouth: Mucous membranes are moist.  Eyes:     General: No scleral icterus.       Right eye: No discharge.        Left eye: No discharge.     Conjunctiva/sclera: Conjunctivae normal.     Pupils: Pupils are equal, round, and reactive to light.  Neck:     Thyroid: No thyromegaly.     Vascular: No JVD.     Trachea: No tracheal deviation.  Cardiovascular:     Rate and Rhythm: Normal rate and regular rhythm.     Heart sounds: Normal heart sounds. No murmur heard.  No friction rub. No gallop.   Pulmonary:     Effort: No respiratory distress.     Breath sounds: Normal breath sounds. No wheezing, rhonchi or rales.  Chest:     Chest wall: No tenderness.  Abdominal:     General: Bowel sounds are normal.     Palpations:  Abdomen is soft. There is no mass.     Tenderness: There is no abdominal tenderness. There is no guarding or rebound.  Musculoskeletal:        General: No tenderness. Normal range of motion.     Cervical back: Normal range of motion and neck supple.  Lymphadenopathy:     Cervical: No cervical adenopathy.  Skin:    General: Skin is warm.     Capillary Refill: Capillary refill takes less than 2 seconds.     Findings: No rash.  Neurological:     Mental Status: He is alert and oriented to person, place,  and time.     Cranial Nerves: No cranial nerve deficit.     Deep Tendon Reflexes: Reflexes are normal and symmetric.     Wt Readings from Last 3 Encounters:  01/18/20 (!) 266 lb (120.7 kg)  07/13/19 253 lb (114.8 kg)  06/27/19 259 lb (117.5 kg)    BP 122/78   Pulse 77   Temp 98.1 F (36.7 C) (Oral)   Ht 6' (1.829 m)   Wt (!) 266 lb (120.7 kg)   SpO2 97%   BMI 36.08 kg/m   Assessment and Plan:  1. Acute maxillary sinusitis, recurrence not specified Acute.  Persistent.  Exam and history is consistent with acute maxillary sinusitis.  Will treat with Augmentin 875 mg twice a day. - amoxicillin-clavulanate (AUGMENTIN) 875-125 MG tablet; Take 1 tablet by mouth 2 (two) times daily.  Dispense: 20 tablet; Refill: 0

## 2020-01-18 NOTE — Patient Instructions (Signed)

## 2020-01-18 NOTE — Telephone Encounter (Unsigned)
Copied from CRM [id number]. Topic: Appointment Scheduling - Scheduling Inquiry for Clinic >> Jan 18, 2020  7:32 AM Leafy Ro wrote: Reason for CRM: Pt is calling and would like to be seen today. Pt is having head congestion and chest congestion x 3 day

## 2020-03-01 ENCOUNTER — Other Ambulatory Visit: Payer: Self-pay | Admitting: Family Medicine

## 2020-03-01 DIAGNOSIS — F329 Major depressive disorder, single episode, unspecified: Secondary | ICD-10-CM

## 2020-03-01 NOTE — Telephone Encounter (Signed)
Requested  medications are  due for refill today yes  Requested medications are on the active medication list yes  Last refill 7/5  Last visit Jan 2021  Future visit scheduled no  Notes to clinic Failed protocol of visit within 6 months, please assess.

## 2020-03-06 ENCOUNTER — Ambulatory Visit (INDEPENDENT_AMBULATORY_CARE_PROVIDER_SITE_OTHER): Payer: 59 | Admitting: Family Medicine

## 2020-03-06 ENCOUNTER — Other Ambulatory Visit: Payer: Self-pay

## 2020-03-06 ENCOUNTER — Encounter: Payer: Self-pay | Admitting: Family Medicine

## 2020-03-06 DIAGNOSIS — E782 Mixed hyperlipidemia: Secondary | ICD-10-CM | POA: Diagnosis not present

## 2020-03-06 DIAGNOSIS — I10 Essential (primary) hypertension: Secondary | ICD-10-CM | POA: Diagnosis not present

## 2020-03-06 DIAGNOSIS — J301 Allergic rhinitis due to pollen: Secondary | ICD-10-CM

## 2020-03-06 DIAGNOSIS — F329 Major depressive disorder, single episode, unspecified: Secondary | ICD-10-CM | POA: Diagnosis not present

## 2020-03-06 MED ORDER — SERTRALINE HCL 50 MG PO TABS: 50.0000 mg | ORAL_TABLET | Freq: Every day | ORAL | 1 refills | Status: DC

## 2020-03-06 MED ORDER — LOSARTAN POTASSIUM 100 MG PO TABS: 100.0000 mg | ORAL_TABLET | Freq: Every day | ORAL | 1 refills | Status: DC

## 2020-03-06 MED ORDER — HYDROCORTISONE (PERIANAL) 2.5 % EX CREA: 1.0000 "application " | TOPICAL_CREAM | Freq: Two times a day (BID) | CUTANEOUS | 0 refills | Status: DC

## 2020-03-06 MED ORDER — EZETIMIBE 10 MG PO TABS: 10.0000 mg | ORAL_TABLET | Freq: Every day | ORAL | 1 refills | Status: DC

## 2020-03-06 NOTE — Progress Notes (Signed)
Date:  03/06/2020   Name:  Erik Valdez   DOB:  1966-03-15   MRN:  [id number]   Chief Complaint: Hyperlipidemia, Hypertension, Depression, and Hemorrhoids  Hyperlipidemia This is a chronic problem. The current episode started more than 1 year ago. The problem is controlled. Recent lipid tests were reviewed and are normal. He has no history of chronic renal disease, diabetes, hypothyroidism, liver disease, obesity or nephrotic syndrome. There are no known factors aggravating his hyperlipidemia. Pertinent negatives include no chest pain, focal sensory loss, focal weakness, leg pain, myalgias or shortness of breath. He is currently on no antihyperlipidemic treatment. The current treatment provides moderate improvement of lipids. There are no compliance problems.   Hypertension This is a chronic problem. The current episode started more than 1 year ago. The problem has been gradually improving since onset. Pertinent negatives include no anxiety, blurred vision, chest pain, headaches, malaise/fatigue, neck pain, orthopnea, palpitations, peripheral edema, PND, shortness of breath or sweats. There are no associated agents to hypertension. There are no known risk factors for coronary artery disease. Past treatments include angiotensin blockers and diuretics. The current treatment provides moderate improvement. There are no compliance problems.  There is no history of angina, kidney disease, CAD/MI, CVA, heart failure, left ventricular hypertrophy, PVD or retinopathy. There is no history of chronic renal disease, a hypertension causing med or renovascular disease.  Depression        This is a chronic problem.  The current episode started more than 1 year ago.   The problem occurs intermittently.  The problem has been gradually improving since onset.  Associated symptoms include no decreased concentration, no fatigue, no helplessness, no hopelessness, does not have insomnia, not irritable, no restlessness,  no decreased interest, no appetite change, no body aches, no myalgias, no headaches, no indigestion, not sad and no suicidal ideas.     The symptoms are aggravated by nothing.  Past treatments include SSRIs - Selective serotonin reuptake inhibitors.  Compliance with treatment is good.  Previous treatment provided moderate relief.   Pertinent negatives include no hypothyroidism and no anxiety.   Lab Results  Component Value Date   CREATININE 0.93 07/13/2019   BUN 12 07/13/2019   NA 138 07/13/2019   K 5.1 07/13/2019   CL 103 07/13/2019   CO2 23 07/13/2019   Lab Results  Component Value Date   CHOL 205 (H) 07/13/2019   HDL 42 07/13/2019   LDLCALC 145 (H) 07/13/2019   TRIG 102 07/13/2019   CHOLHDL 5.1 (H) 01/05/2019   No results found for: TSH No results found for: HGBA1C No results found for: WBC, HGB, HCT, MCV, PLT Lab Results  Component Value Date   ALT 39 01/05/2019   AST 22 01/05/2019   ALKPHOS 90 01/05/2019   BILITOT 0.6 01/05/2019     Review of Systems  Constitutional: Negative for appetite change, chills, fatigue, fever and malaise/fatigue.  HENT: Negative for drooling, ear discharge, ear pain and sore throat.   Eyes: Negative for blurred vision.  Respiratory: Negative for cough, shortness of breath and wheezing.   Cardiovascular: Negative for chest pain, palpitations, orthopnea, leg swelling and PND.  Gastrointestinal: Negative for abdominal pain, blood in stool, constipation, diarrhea and nausea.  Endocrine: Negative for polydipsia.  Genitourinary: Negative for dysuria, frequency, hematuria and urgency.  Musculoskeletal: Negative for back pain, myalgias and neck pain.  Skin: Negative for rash.  Allergic/Immunologic: Negative for environmental allergies.  Neurological: Negative for dizziness, focal weakness and headaches.  Hematological: Does not bruise/bleed easily.  Psychiatric/Behavioral: Positive for depression. Negative for decreased concentration and suicidal  ideas. The patient is not nervous/anxious and does not have insomnia.     Patient Active Problem List   Diagnosis Date Noted  . Reactive depression 07/13/2019  . Anxiety 07/13/2019  . Pure hypercholesterolemia 10/13/2017  . External hemorrhoid 09/28/2016  . Mixed hyperlipidemia 09/28/2016  . Chronic seasonal allergic rhinitis due to pollen 09/28/2016  . Blood in stool   . Laboratory animal allergy 10/26/2014  . Acute back pain with sciatica 10/26/2014  . Routine general medical examination at a health care facility 10/26/2014  . Essential hypertension 10/26/2014  . Adult BMI 30+ 10/26/2014    No Known Allergies  Past Surgical History:  Procedure Laterality Date  . COLONOSCOPY WITH PROPOFOL N/A 01/16/2016   Procedure: COLONOSCOPY WITH PROPOFOL;  Surgeon: Midge Minium, MD;  Location: [facility];  Service: Endoscopy;  Laterality: N/A;  . NASAL SINUS SURGERY     age 65 or 8    Social History   Tobacco Use  . Smoking status: Never Smoker  . Smokeless tobacco: Former Neurosurgeon    Types: Chew  . Tobacco comment: Smokeless tobacco - occasionally (golfing, etc)  Substance Use Topics  . Alcohol use: Yes    Alcohol/week: 6.0 standard drinks    Types: 6 Cans of beer per week  . Drug use: No     Medication list has been reviewed and updated.  Current Meds  Medication Sig  . ezetimibe (ZETIA) 10 MG tablet Take 1 tablet (10 mg total) by mouth daily.  . fluticasone (FLONASE) 50 MCG/ACT nasal spray Place 1 spray into both nostrils daily. 3 bottle at a visit  . hydrocortisone (ANUSOL-HC) 2.5 % rectal cream Place 1 application rectally 2 (two) times daily.  Marland Kitchen loratadine (CLARITIN) 10 MG tablet Take 1 tablet (10 mg total) by mouth daily.  Marland Kitchen losartan (COZAAR) 100 MG tablet TAKE 1 TABLET BY MOUTH  DAILY  . sertraline (ZOLOFT) 50 MG tablet TAKE 1 TABLET BY MOUTH  DAILY  . [DISCONTINUED] propranolol (INDERAL) 10 MG tablet Take 1 tablet (10 mg total) by mouth 2 (two) times daily.     PHQ 2/9 Scores 03/06/2020 01/18/2020 07/13/2019 06/27/2019  PHQ - 2 Score 0 0 0 3  PHQ- 9 Score 0 0 2 19    GAD 7 : Generalized Anxiety Score 03/06/2020 01/18/2020 07/13/2019 06/27/2019  Nervous, Anxious, on Edge 0 0 0 3  Control/stop worrying 0 0 2 3  Worry too much - different things 0 0 3 3  Trouble relaxing 0 0 1 3  Restless 2 0 0 3  Easily annoyed or irritable 0 0 1 3  Afraid - awful might happen 0 0 1 3  Total GAD 7 Score 2 0 8 21  Anxiety Difficulty Not difficult at all Not difficult at all Not difficult at all Somewhat difficult    BP Readings from Last 3 Encounters:  03/06/20 122/80  01/18/20 122/78  07/13/19 (!) 130/98    Physical Exam Vitals and nursing note reviewed.  Constitutional:      General: He is not irritable. HENT:     Head: Normocephalic.     Right Ear: Tympanic membrane, ear canal and external ear normal.     Left Ear: Tympanic membrane, ear canal and external ear normal.     Nose: Nose normal. No congestion or rhinorrhea.     Mouth/Throat:     Mouth: Mucous membranes are  moist.  Eyes:     General: No scleral icterus.       Right eye: No discharge.        Left eye: No discharge.     Conjunctiva/sclera: Conjunctivae normal.     Pupils: Pupils are equal, round, and reactive to light.  Neck:     Thyroid: No thyromegaly.     Vascular: No JVD.     Trachea: No tracheal deviation.  Cardiovascular:     Rate and Rhythm: Normal rate and regular rhythm.     Heart sounds: Normal heart sounds. No murmur heard.  No friction rub. No gallop.   Pulmonary:     Effort: No respiratory distress.     Breath sounds: Normal breath sounds. No wheezing, rhonchi or rales.  Abdominal:     General: Bowel sounds are normal.     Palpations: Abdomen is soft. There is no mass.     Tenderness: There is no abdominal tenderness. There is no guarding or rebound.  Musculoskeletal:        General: No tenderness. Normal range of motion.     Cervical back: Normal range of motion  and neck supple.  Lymphadenopathy:     Cervical: No cervical adenopathy.  Skin:    General: Skin is warm.     Findings: No rash.  Neurological:     Mental Status: He is alert and oriented to person, place, and time.     Cranial Nerves: No cranial nerve deficit.     Deep Tendon Reflexes: Reflexes are normal and symmetric.     Wt Readings from Last 3 Encounters:  03/06/20 268 lb (121.6 kg)  01/18/20 (!) 266 lb (120.7 kg)  07/13/19 253 lb (114.8 kg)    BP 122/80   Pulse 76   Ht 6' (1.829 m)   Wt 268 lb (121.6 kg)   BMI 36.35 kg/m   Assessment and Plan: 1. Mixed hyperlipidemia Chronic.  Controlled.  Stable.  Continue Zetia 10 mg once a day recheck lipid panel. - ezetimibe (ZETIA) 10 MG tablet; Take 1 tablet (10 mg total) by mouth daily.  Dispense: 90 tablet; Refill: 1 - Lipid Panel With LDL/HDL Ratio  2   Chronic seasonal allergic rhinitis due to pollen Chronic.  Seasonal.  Currently stable.  Patient to continue loratadine and Flonase on an as-needed basis.  3. Essential hypertension Chronic.  Controlled.  Stable.  Blood pressure 122/80.  Patient experiencing no concerning side effects or symptoms.  Continue losartan 100 mg once a day.  Will check renal function panel for electrolytes and GFR. - losartan (COZAAR) 100 MG tablet; Take 1 tablet (100 mg total) by mouth daily.  Dispense: 90 tablet; Refill: 1 - Renal Function Panel  4. Reactive depression Chronic.  Controlled.  Stable.  PHQ 0.  Gad score 0.  We will continue sertraline 50 mg once a day.  Will recheck in 6 months. - sertraline (ZOLOFT) 50 MG tablet; Take 1 tablet (50 mg total) by mouth daily.  Dispense: 90 tablet; Refill: 1

## 2020-03-07 LAB — LIPID PANEL WITH LDL/HDL RATIO
Cholesterol, Total: 235 mg/dL — ABNORMAL HIGH (ref 100–199)
HDL: 38 mg/dL — ABNORMAL LOW (ref >39)
LDL Chol Calc (NIH): 169 mg/dL — ABNORMAL HIGH (ref 0–99)
LDL/HDL Ratio: 4.4 ratio — ABNORMAL HIGH (ref 0.0–3.6)
Triglycerides: 153 mg/dL — ABNORMAL HIGH (ref 0–149)
VLDL Cholesterol Cal: 28 mg/dL (ref 5–40)

## 2020-03-07 LAB — RENAL FUNCTION PANEL
Albumin: 4.4 g/dL (ref 3.8–4.9)
BUN/Creatinine Ratio: 11 (ref 9–20)
BUN: 10 mg/dL (ref 6–24)
CO2: 22 mmol/L (ref 20–29)
Calcium: 9.4 mg/dL (ref 8.7–10.2)
Chloride: 103 mmol/L (ref 96–106)
Creatinine, Ser: 0.95 mg/dL (ref 0.76–1.27)
GFR calc Af Amer: 106 mL/min/{1.73_m2} (ref >59)
GFR calc non Af Amer: 92 mL/min/{1.73_m2} (ref >59)
Glucose: 116 mg/dL — ABNORMAL HIGH (ref 65–99)
Phosphorus: 2.6 mg/dL — ABNORMAL LOW (ref 2.8–4.1)
Potassium: 4.5 mmol/L (ref 3.5–5.2)
Sodium: 140 mmol/L (ref 134–144)

## 2020-03-08 ENCOUNTER — Other Ambulatory Visit: Payer: Self-pay

## 2020-03-08 ENCOUNTER — Telehealth: Payer: Self-pay | Admitting: Family Medicine

## 2020-03-08 DIAGNOSIS — E782 Mixed hyperlipidemia: Secondary | ICD-10-CM

## 2020-03-08 MED ORDER — EZETIMIBE 10 MG PO TABS: 10.0000 mg | ORAL_TABLET | Freq: Every day | ORAL | 0 refills | Status: DC

## 2020-03-08 NOTE — Telephone Encounter (Signed)
Spoke to patient

## 2020-03-08 NOTE — Telephone Encounter (Signed)
Patient is calling back for lab results. Cb- [phone]

## 2020-08-15 ENCOUNTER — Other Ambulatory Visit: Payer: Self-pay | Admitting: Family Medicine

## 2020-08-15 DIAGNOSIS — E782 Mixed hyperlipidemia: Secondary | ICD-10-CM

## 2020-08-16 ENCOUNTER — Other Ambulatory Visit: Payer: Self-pay | Admitting: Family Medicine

## 2020-08-16 DIAGNOSIS — J301 Allergic rhinitis due to pollen: Secondary | ICD-10-CM

## 2020-08-23 ENCOUNTER — Ambulatory Visit: Payer: 59 | Admitting: Family Medicine

## 2020-08-26 ENCOUNTER — Encounter: Payer: Self-pay | Admitting: Family Medicine

## 2020-08-26 ENCOUNTER — Other Ambulatory Visit: Payer: Self-pay

## 2020-08-26 ENCOUNTER — Ambulatory Visit (INDEPENDENT_AMBULATORY_CARE_PROVIDER_SITE_OTHER): Payer: 59 | Admitting: Family Medicine

## 2020-08-26 VITALS — BP 140/98 | HR 68 | Ht 72.0 in | Wt 275.0 lb

## 2020-08-26 DIAGNOSIS — E782 Mixed hyperlipidemia: Secondary | ICD-10-CM

## 2020-08-26 DIAGNOSIS — L237 Allergic contact dermatitis due to plants, except food: Secondary | ICD-10-CM

## 2020-08-26 DIAGNOSIS — F329 Major depressive disorder, single episode, unspecified: Secondary | ICD-10-CM

## 2020-08-26 DIAGNOSIS — J301 Allergic rhinitis due to pollen: Secondary | ICD-10-CM | POA: Diagnosis not present

## 2020-08-26 DIAGNOSIS — I10 Essential (primary) hypertension: Secondary | ICD-10-CM

## 2020-08-26 MED ORDER — LOSARTAN POTASSIUM 100 MG PO TABS: 100.0000 mg | ORAL_TABLET | Freq: Every day | ORAL | 1 refills | Status: DC

## 2020-08-26 MED ORDER — FLUTICASONE PROPIONATE 50 MCG/ACT NA SUSP: NASAL | 1 refills | Status: DC

## 2020-08-26 MED ORDER — PREDNISONE 10 MG PO TABS: ORAL_TABLET | ORAL | 0 refills | Status: DC

## 2020-08-26 MED ORDER — SERTRALINE HCL 50 MG PO TABS: 50.0000 mg | ORAL_TABLET | Freq: Every day | ORAL | 1 refills | Status: DC

## 2020-08-26 MED ORDER — AMLODIPINE BESYLATE 2.5 MG PO TABS: 2.5000 mg | ORAL_TABLET | Freq: Every day | ORAL | 1 refills | Status: DC

## 2020-08-26 NOTE — Progress Notes (Signed)
Date:  08/26/2020   Name:  Erik Valdez   DOB:  September 25, 1965   MRN:  [id number]   Chief Complaint: Hypertension, Hyperlipidemia, Depression, Allergic Rhinitis , and Rash (Itching and red places on legs and arms)  Hypertension This is a chronic problem. The current episode started more than 1 year ago. The problem has been waxing and waning since onset. The problem is uncontrolled. Pertinent negatives include no anxiety, blurred vision, chest pain, headaches, malaise/fatigue, neck pain, orthopnea, palpitations, peripheral edema, PND, shortness of breath or sweats. There are no associated agents to hypertension. There are no compliance problems.  There is no history of angina, kidney disease, CAD/MI, CVA, heart failure, left ventricular hypertrophy, PVD or retinopathy. There is no history of chronic renal disease, a hypertension causing med or renovascular disease.  Hyperlipidemia This is a chronic problem. The current episode started more than 1 year ago. Recent lipid tests were reviewed and are high. He has no history of chronic renal disease, hypothyroidism, liver disease, obesity or nephrotic syndrome. Factors aggravating his hyperlipidemia include thiazides. Pertinent negatives include no chest pain, focal sensory loss, focal weakness, leg pain, myalgias or shortness of breath. Current antihyperlipidemic treatment includes statins. The current treatment provides moderate improvement of lipids. There are no compliance problems.   Depression        This is a chronic problem.  The current episode started more than 1 year ago.   The onset quality is gradual.   The problem occurs intermittently.  The problem has been gradually improving since onset.  Associated symptoms include no decreased concentration, no fatigue, no helplessness, no hopelessness, does not have insomnia, not irritable, no restlessness, no decreased interest, no appetite change, no body aches, no myalgias, no headaches, no  indigestion, not sad and no suicidal ideas.  Past treatments include SSRIs - Selective serotonin reuptake inhibitors.  Compliance with treatment is variable.  Previous treatment provided moderate relief.   Pertinent negatives include no hypothyroidism and no anxiety. Rash This is a chronic problem. The current episode started more than 1 year ago. The problem has been gradually improving since onset. The affected locations include the left arm, right arm, right lower leg, right upper leg, left upper leg and left lower leg. Pertinent negatives include no fatigue or shortness of breath.    Lab Results  Component Value Date   CREATININE 0.95 03/06/2020   BUN 10 03/06/2020   NA 140 03/06/2020   K 4.5 03/06/2020   CL 103 03/06/2020   CO2 22 03/06/2020   Lab Results  Component Value Date   CHOL 235 (H) 03/06/2020   HDL 38 (L) 03/06/2020   LDLCALC 169 (H) 03/06/2020   TRIG 153 (H) 03/06/2020   CHOLHDL 5.1 (H) 01/05/2019   No results found for: TSH No results found for: HGBA1C No results found for: WBC, HGB, HCT, MCV, PLT Lab Results  Component Value Date   ALT 39 01/05/2019   AST 22 01/05/2019   ALKPHOS 90 01/05/2019   BILITOT 0.6 01/05/2019     Review of Systems  Constitutional: Negative for appetite change, fatigue and malaise/fatigue.  Eyes: Negative for blurred vision.  Respiratory: Negative for shortness of breath.   Cardiovascular: Negative for chest pain, palpitations, orthopnea and PND.  Musculoskeletal: Negative for myalgias and neck pain.  Skin: Positive for rash.  Neurological: Negative for focal weakness and headaches.  Psychiatric/Behavioral: Positive for depression. Negative for decreased concentration and suicidal ideas. The patient does not have insomnia.  Patient Active Problem List   Diagnosis Date Noted  . Reactive depression 07/13/2019  . Anxiety 07/13/2019  . Pure hypercholesterolemia 10/13/2017  . External hemorrhoid 09/28/2016  . Mixed  hyperlipidemia 09/28/2016  . Chronic seasonal allergic rhinitis due to pollen 09/28/2016  . Blood in stool   . Laboratory animal allergy 10/26/2014  . Acute back pain with sciatica 10/26/2014  . Routine general medical examination at a health care facility 10/26/2014  . Essential hypertension 10/26/2014  . Adult BMI 30+ 10/26/2014    No Known Allergies  Past Surgical History:  Procedure Laterality Date  . COLONOSCOPY WITH PROPOFOL N/A 01/16/2016   Procedure: COLONOSCOPY WITH PROPOFOL;  Surgeon: Midge Minium, MD;  Location: [facility];  Service: Endoscopy;  Laterality: N/A;  . NASAL SINUS SURGERY     age 75 or 8    Social History   Tobacco Use  . Smoking status: Never Smoker  . Smokeless tobacco: Former Neurosurgeon    Types: Chew  . Tobacco comment: Smokeless tobacco - occasionally (golfing, etc)  Substance Use Topics  . Alcohol use: Yes    Alcohol/week: 6.0 standard drinks    Types: 6 Cans of beer per week  . Drug use: No     Medication list has been reviewed and updated.  Current Meds  Medication Sig  . ezetimibe (ZETIA) 10 MG tablet TAKE 1 TABLET BY MOUTH  DAILY  . fluticasone (FLONASE) 50 MCG/ACT nasal spray USE 1 SPRAY IN BOTH  NOSTRILS DAILY  . loratadine (CLARITIN) 10 MG tablet Take 1 tablet (10 mg total) by mouth daily.  Marland Kitchen losartan (COZAAR) 100 MG tablet Take 1 tablet (100 mg total) by mouth daily.  Marland Kitchen PROCTOSOL HC 2.5 % rectal cream PLACE 1 APPLICATION  RECTALLY TWICE DAILY  . sertraline (ZOLOFT) 50 MG tablet Take 1 tablet (50 mg total) by mouth daily.    PHQ 2/9 Scores 08/26/2020 03/06/2020 01/18/2020 07/13/2019  PHQ - 2 Score 0 0 0 0  PHQ- 9 Score 0 0 0 2    GAD 7 : Generalized Anxiety Score 08/26/2020 03/06/2020 01/18/2020 07/13/2019  Nervous, Anxious, on Edge 0 0 0 0  Control/stop worrying 0 0 0 2  Worry too much - different things 0 0 0 3  Trouble relaxing 0 0 0 1  Restless 0 2 0 0  Easily annoyed or irritable 0 0 0 1  Afraid - awful might happen 0 0 0 1   Total GAD 7 Score 0 2 0 8  Anxiety Difficulty - Not difficult at all Not difficult at all Not difficult at all    BP Readings from Last 3 Encounters:  08/26/20 (!) 140/98  03/06/20 122/80  01/18/20 122/78    Physical Exam Constitutional:      General: He is not irritable.    Wt Readings from Last 3 Encounters:  08/26/20 275 lb (124.7 kg)  03/06/20 268 lb (121.6 kg)  01/18/20 (!) 266 lb (120.7 kg)    BP (!) 140/98   Pulse 68   Ht 6' (1.829 m)   Wt 275 lb (124.7 kg)   BMI 37.30 kg/m   Assessment and Plan:  1. Essential hypertension Chronic.  Uncontrolled.  Stable.  Patient has not been able to take hydrochlorothiazide again and therefore did not continue.  Blood pressure is currently 140/98 and we will need to continue losartan 100 mg once a day but add amlodipine 2.5 mg once a day.  Will recheck blood pressure in 6 weeks.  Will check CMP for electrolyte and GFR concerns. - losartan (COZAAR) 100 MG tablet; Take 1 tablet (100 mg total) by mouth daily.  Dispense: 90 tablet; Refill: 1 - Comprehensive Metabolic Panel (CMET)  2. Mixed hyperlipidemia Chronic.  Controlled.  Stable.  Weight has been discussed and patient is been suggested to lower lipid intake.  Until we get a better handle on his rash we will discontinue the Zetia and resume after 2 weeks of taking his prednisone. - Lipid Panel With LDL/HDL Ratio  3. Chronic seasonal allergic rhinitis due to pollen New onset for this season.  Episodic.  Waxing and waning in symptomatology.  Patient will resume fluticasone 1 spray in both nostrils once a day. - fluticasone (FLONASE) 50 MCG/ACT nasal spray; USE 1 SPRAY IN BOTH  NOSTRILS DAILY  Dispense: 32 g; Refill: 1  4. Reactive depression Chronic.  Controlled.  Stable.  PHQ is 0 Gad score 0 over his continue Zoloft 50 mg once a day. - sertraline (ZOLOFT) 50 MG tablet; Take 1 tablet (50 mg total) by mouth daily.  Dispense: 90 tablet; Refill: 1  5. Contact dermatitis due to  poison oak New onset.  Patient has a pruritic rash over the forearms and lower upper arms as well as lower legs and lower  thighs.  There is multiple scratch marks indicating that is very pruritic.  Patient was giving a short tapering dose that was only at maybe 40 mg of prednisone which was probably a little too quick of a taper we will resume prednisone at 60 mg tapering over a 2-week.  And at that time patient will resume his Zetia for his cholesterol control

## 2020-08-28 LAB — COMPREHENSIVE METABOLIC PANEL
ALT: 43 IU/L (ref 0–44)
AST: 13 IU/L (ref 0–40)
Albumin/Globulin Ratio: 1.8 (ref 1.2–2.2)
Albumin: 4.4 g/dL (ref 3.8–4.9)
Alkaline Phosphatase: 103 IU/L (ref 44–121)
BUN/Creatinine Ratio: 16 (ref 9–20)
BUN: 17 mg/dL (ref 6–24)
Bilirubin Total: 0.4 mg/dL (ref 0.0–1.2)
CO2: 21 mmol/L (ref 20–29)
Calcium: 9.2 mg/dL (ref 8.7–10.2)
Chloride: 101 mmol/L (ref 96–106)
Creatinine, Ser: 1.08 mg/dL (ref 0.76–1.27)
Globulin, Total: 2.5 g/dL (ref 1.5–4.5)
Glucose: 105 mg/dL — ABNORMAL HIGH (ref 65–99)
Potassium: 4.4 mmol/L (ref 3.5–5.2)
Sodium: 140 mmol/L (ref 134–144)
Total Protein: 6.9 g/dL (ref 6.0–8.5)
eGFR: 82 mL/min/{1.73_m2} (ref >59)

## 2020-08-28 LAB — LIPID PANEL WITH LDL/HDL RATIO
Cholesterol, Total: 184 mg/dL (ref 100–199)
HDL: 46 mg/dL (ref >39)
LDL Chol Calc (NIH): 123 mg/dL — ABNORMAL HIGH (ref 0–99)
LDL/HDL Ratio: 2.7 ratio (ref 0.0–3.6)
Triglycerides: 81 mg/dL (ref 0–149)
VLDL Cholesterol Cal: 15 mg/dL (ref 5–40)

## 2020-10-07 ENCOUNTER — Ambulatory Visit: Payer: 59 | Admitting: Family Medicine

## 2020-10-08 ENCOUNTER — Encounter: Payer: Self-pay | Admitting: Family Medicine

## 2020-10-08 ENCOUNTER — Ambulatory Visit (INDEPENDENT_AMBULATORY_CARE_PROVIDER_SITE_OTHER): Payer: 59 | Admitting: Family Medicine

## 2020-10-08 ENCOUNTER — Other Ambulatory Visit: Payer: Self-pay

## 2020-10-08 VITALS — BP 120/80 | HR 84 | Ht 72.0 in | Wt 280.0 lb

## 2020-10-08 DIAGNOSIS — I10 Essential (primary) hypertension: Secondary | ICD-10-CM

## 2020-10-08 DIAGNOSIS — E782 Mixed hyperlipidemia: Secondary | ICD-10-CM | POA: Diagnosis not present

## 2020-10-08 DIAGNOSIS — F329 Major depressive disorder, single episode, unspecified: Secondary | ICD-10-CM | POA: Diagnosis not present

## 2020-10-08 MED ORDER — EZETIMIBE 10 MG PO TABS: 10.0000 mg | ORAL_TABLET | Freq: Every day | ORAL | 1 refills | Status: DC

## 2020-10-08 MED ORDER — SERTRALINE HCL 50 MG PO TABS: 50.0000 mg | ORAL_TABLET | Freq: Every day | ORAL | 1 refills | Status: DC

## 2020-10-08 MED ORDER — LOSARTAN POTASSIUM 100 MG PO TABS: 100.0000 mg | ORAL_TABLET | Freq: Every day | ORAL | 1 refills | Status: DC

## 2020-10-08 MED ORDER — AMLODIPINE BESYLATE 2.5 MG PO TABS: 2.5000 mg | ORAL_TABLET | Freq: Every day | ORAL | 1 refills | Status: DC

## 2020-10-08 NOTE — Progress Notes (Signed)
Date:  10/08/2020   Name:  Erik Valdez   DOB:  March 13, 1966   MRN:  [id number]   Chief Complaint: Hypertension  Hypertension This is a chronic problem. The current episode started more than 1 year ago. The problem has been gradually improving since onset. The problem is controlled. Pertinent negatives include no anxiety, blurred vision, chest pain, headaches, malaise/fatigue, neck pain, orthopnea, palpitations, peripheral edema, PND, shortness of breath or sweats. There are no associated agents to hypertension.    Lab Results  Component Value Date   CREATININE 1.08 08/27/2020   BUN 17 08/27/2020   NA 140 08/27/2020   K 4.4 08/27/2020   CL 101 08/27/2020   CO2 21 08/27/2020   Lab Results  Component Value Date   CHOL 184 08/27/2020   HDL 46 08/27/2020   LDLCALC 123 (H) 08/27/2020   TRIG 81 08/27/2020   CHOLHDL 5.1 (H) 01/05/2019   No results found for: TSH No results found for: HGBA1C No results found for: WBC, HGB, HCT, MCV, PLT Lab Results  Component Value Date   ALT 43 08/27/2020   AST 13 08/27/2020   ALKPHOS 103 08/27/2020   BILITOT 0.4 08/27/2020     Review of Systems  Constitutional: Negative for chills, fever and malaise/fatigue.  HENT: Negative for drooling, ear discharge, ear pain and sore throat.   Eyes: Negative for blurred vision.  Respiratory: Negative for cough, shortness of breath and wheezing.   Cardiovascular: Negative for chest pain, palpitations, orthopnea, leg swelling and PND.  Gastrointestinal: Negative for abdominal pain, blood in stool, constipation, diarrhea and nausea.  Endocrine: Negative for polydipsia.  Genitourinary: Negative for dysuria, frequency, hematuria and urgency.  Musculoskeletal: Negative for back pain, myalgias and neck pain.  Skin: Negative for rash.  Allergic/Immunologic: Negative for environmental allergies.  Neurological: Negative for dizziness and headaches.  Hematological: Does not bruise/bleed easily.   Psychiatric/Behavioral: Negative for suicidal ideas. The patient is not nervous/anxious.     Patient Active Problem List   Diagnosis Date Noted  . Reactive depression 07/13/2019  . Anxiety 07/13/2019  . Pure hypercholesterolemia 10/13/2017  . External hemorrhoid 09/28/2016  . Mixed hyperlipidemia 09/28/2016  . Chronic seasonal allergic rhinitis due to pollen 09/28/2016  . Blood in stool   . Laboratory animal allergy 10/26/2014  . Acute back pain with sciatica 10/26/2014  . Routine general medical examination at a health care facility 10/26/2014  . Essential hypertension 10/26/2014  . Adult BMI 30+ 10/26/2014    No Known Allergies  Past Surgical History:  Procedure Laterality Date  . COLONOSCOPY WITH PROPOFOL N/A 01/16/2016   Procedure: COLONOSCOPY WITH PROPOFOL;  Surgeon: Midge Minium, MD;  Location: [facility];  Service: Endoscopy;  Laterality: N/A;  . NASAL SINUS SURGERY     age 78 or 8    Social History   Tobacco Use  . Smoking status: Never Smoker  . Smokeless tobacco: Former Neurosurgeon    Types: Chew  . Tobacco comment: Smokeless tobacco - occasionally (golfing, etc)  Substance Use Topics  . Alcohol use: Yes    Alcohol/week: 6.0 standard drinks    Types: 6 Cans of beer per week  . Drug use: No     Medication list has been reviewed and updated.  Current Meds  Medication Sig  . amLODipine (NORVASC) 2.5 MG tablet Take 1 tablet (2.5 mg total) by mouth daily.  Marland Kitchen ezetimibe (ZETIA) 10 MG tablet TAKE 1 TABLET BY MOUTH  DAILY  . fluticasone (FLONASE)  50 MCG/ACT nasal spray USE 1 SPRAY IN BOTH  NOSTRILS DAILY  . loratadine (CLARITIN) 10 MG tablet Take 1 tablet (10 mg total) by mouth daily.  Marland Kitchen losartan (COZAAR) 100 MG tablet Take 1 tablet (100 mg total) by mouth daily.  . sertraline (ZOLOFT) 50 MG tablet Take 1 tablet (50 mg total) by mouth daily.    PHQ 2/9 Scores 08/26/2020 03/06/2020 01/18/2020 07/13/2019  PHQ - 2 Score 0 0 0 0  PHQ- 9 Score 0 0 0 2    GAD 7 :  Generalized Anxiety Score 08/26/2020 03/06/2020 01/18/2020 07/13/2019  Nervous, Anxious, on Edge 0 0 0 0  Control/stop worrying 0 0 0 2  Worry too much - different things 0 0 0 3  Trouble relaxing 0 0 0 1  Restless 0 2 0 0  Easily annoyed or irritable 0 0 0 1  Afraid - awful might happen 0 0 0 1  Total GAD 7 Score 0 2 0 8  Anxiety Difficulty - Not difficult at all Not difficult at all Not difficult at all    BP Readings from Last 3 Encounters:  10/08/20 120/80  08/26/20 (!) 140/98  03/06/20 122/80    Physical Exam Vitals and nursing note reviewed.  HENT:     Head: Normocephalic.     Right Ear: Tympanic membrane, ear canal and external ear normal. There is no impacted cerumen.     Left Ear: Tympanic membrane, ear canal and external ear normal. There is no impacted cerumen.     Nose: Nose normal. No congestion or rhinorrhea.     Mouth/Throat:     Mouth: Mucous membranes are moist.  Eyes:     General: No scleral icterus.       Right eye: No discharge.        Left eye: No discharge.     Conjunctiva/sclera: Conjunctivae normal.     Pupils: Pupils are equal, round, and reactive to light.  Neck:     Thyroid: No thyromegaly.     Vascular: No JVD.     Trachea: No tracheal deviation.  Cardiovascular:     Rate and Rhythm: Normal rate and regular rhythm.     Heart sounds: Normal heart sounds. No murmur heard. No friction rub. No gallop.   Pulmonary:     Effort: No respiratory distress.     Breath sounds: Normal breath sounds. No wheezing, rhonchi or rales.  Abdominal:     General: Bowel sounds are normal.     Palpations: Abdomen is soft. There is no mass.     Tenderness: There is no abdominal tenderness. There is no guarding or rebound.  Musculoskeletal:        General: No tenderness. Normal range of motion.     Cervical back: Normal range of motion and neck supple.  Lymphadenopathy:     Cervical: No cervical adenopathy.  Skin:    General: Skin is warm.     Findings: No rash.   Neurological:     Mental Status: He is alert and oriented to person, place, and time.     Cranial Nerves: No cranial nerve deficit.     Deep Tendon Reflexes: Reflexes are normal and symmetric.     Wt Readings from Last 3 Encounters:  10/08/20 280 lb (127 kg)  08/26/20 275 lb (124.7 kg)  03/06/20 268 lb (121.6 kg)    BP 120/80   Pulse 84   Ht 6' (1.829 m)   Wt 280 lb (127  kg)   BMI 37.97 kg/m   Assessment and Plan: 1. Essential hypertension Chronic.  Controlled.  Stable.  Blood pressure today is 120/80.  We will continue losartan 100 mg once a day, amlodipine 2.5 mg, and losartan 100 mg once a day.  Review of previous renal panel is acceptable. - losartan (COZAAR) 100 MG tablet; Take 1 tablet (100 mg total) by mouth daily.  Dispense: 90 tablet; Refill: 1  2. Mixed hyperlipidemia Chronic.  Controlled.  Stable.  Continue acetamide 10 mg once a day.  Patient will be rechecked again in 6 months at which time we will do fasting lipid panel. - ezetimibe (ZETIA) 10 MG tablet; Take 1 tablet (10 mg total) by mouth daily.  Dispense: 90 tablet; Refill: 1  3. Reactive depression Chronic.  Controlled.  Stable.  PHQ is 0 and gad score is 0.  We will continue sertraline 50 mg once a day. - sertraline (ZOLOFT) 50 MG tablet; Take 1 tablet (50 mg total) by mouth daily.  Dispense: 90 tablet; Refill: 1

## 2020-12-26 ENCOUNTER — Ambulatory Visit
Admission: RE | Admit: 2020-12-26 | Discharge: 2020-12-26 | Disposition: A | Discharge status: Home / Self Care | Payer: 59 | Source: Ambulatory Visit | Attending: Family Medicine | Admitting: Family Medicine

## 2020-12-26 ENCOUNTER — Other Ambulatory Visit: Payer: Self-pay

## 2020-12-26 ENCOUNTER — Ambulatory Visit (INDEPENDENT_AMBULATORY_CARE_PROVIDER_SITE_OTHER): Payer: 59 | Admitting: Family Medicine

## 2020-12-26 ENCOUNTER — Encounter: Payer: Self-pay | Admitting: Family Medicine

## 2020-12-26 ENCOUNTER — Other Ambulatory Visit
Admission: RE | Admit: 2020-12-26 | Discharge: 2020-12-26 | Disposition: A | Discharge status: Home / Self Care | Payer: 59 | Source: Home / Self Care | Attending: Family Medicine | Admitting: Family Medicine

## 2020-12-26 VITALS — BP 100/60 | HR 112 | Temp 98.6°F | Ht 72.0 in | Wt 263.0 lb

## 2020-12-26 DIAGNOSIS — R509 Fever, unspecified: Secondary | ICD-10-CM

## 2020-12-26 DIAGNOSIS — R0602 Shortness of breath: Secondary | ICD-10-CM

## 2020-12-26 LAB — CBC WITH DIFFERENTIAL/PLATELET
Abs Immature Granulocytes: 0.24 10*3/uL — ABNORMAL HIGH (ref 0.00–0.07)
Basophils Absolute: 0.1 10*3/uL (ref 0.0–0.1)
Basophils Relative: 1 %
Eosinophils Absolute: 0 10*3/uL (ref 0.0–0.5)
Eosinophils Relative: 0 %
HCT: 40.7 % (ref 39.0–52.0)
Hemoglobin: 14.4 g/dL (ref 13.0–17.0)
Immature Granulocytes: 2 %
Lymphocytes Relative: 11 %
Lymphs Abs: 1.3 10*3/uL (ref 0.7–4.0)
MCH: 31.2 pg (ref 26.0–34.0)
MCHC: 35.4 g/dL (ref 30.0–36.0)
MCV: 88.3 fL (ref 80.0–100.0)
Monocytes Absolute: 0.8 10*3/uL (ref 0.1–1.0)
Monocytes Relative: 7 %
Neutro Abs: 9.4 10*3/uL — ABNORMAL HIGH (ref 1.7–7.7)
Neutrophils Relative %: 79 %
Platelets: 341 10*3/uL (ref 150–400)
RBC: 4.61 MIL/uL (ref 4.22–5.81)
RDW: 12 % (ref 11.5–15.5)
WBC: 11.8 10*3/uL — ABNORMAL HIGH (ref 4.0–10.5)
nRBC: 0 % (ref 0.0–0.2)

## 2020-12-26 LAB — POCT INFLUENZA A/B
Influenza A, POC: NEGATIVE
Influenza B, POC: NEGATIVE

## 2020-12-26 MED ORDER — MOLNUPIRAVIR EUA 200MG CAPSULE: 4.0000 | ORAL_CAPSULE | Freq: Two times a day (BID) | ORAL | 0 refills | Status: AC

## 2020-12-26 NOTE — Progress Notes (Signed)
Date:  12/26/2020   Name:  Erik Valdez   DOB:  1965-07-13   MRN:  [id number]   Chief Complaint: Cough (Headaches, fever off and on- has taken 2 COVID tests/ negative. Was put on tamiflu on 7/2 but not tested. )  Cough This is a chronic problem. The current episode started in the past 7 days. The problem has been unchanged. The problem occurs constantly. The cough is Non-productive. Associated symptoms include chills, a fever, myalgias, shortness of breath and sweats. Pertinent negatives include no chest pain, ear congestion, ear pain, headaches, nasal congestion, rash, sore throat or wheezing. The treatment provided mild relief. There is no history of environmental allergies.   Lab Results  Component Value Date   CREATININE 1.08 08/27/2020   BUN 17 08/27/2020   NA 140 08/27/2020   K 4.4 08/27/2020   CL 101 08/27/2020   CO2 21 08/27/2020   Lab Results  Component Value Date   CHOL 184 08/27/2020   HDL 46 08/27/2020   LDLCALC 123 (H) 08/27/2020   TRIG 81 08/27/2020   CHOLHDL 5.1 (H) 01/05/2019   No results found for: TSH No results found for: HGBA1C No results found for: WBC, HGB, HCT, MCV, PLT Lab Results  Component Value Date   ALT 43 08/27/2020   AST 13 08/27/2020   ALKPHOS 103 08/27/2020   BILITOT 0.4 08/27/2020     Review of Systems  Constitutional:  Positive for chills and fever.  HENT:  Negative for drooling, ear discharge, ear pain and sore throat.   Respiratory:  Positive for cough and shortness of breath. Negative for wheezing.   Cardiovascular:  Negative for chest pain, palpitations and leg swelling.  Gastrointestinal:  Negative for abdominal pain, blood in stool, constipation, diarrhea and nausea.  Endocrine: Negative for polydipsia.  Genitourinary:  Negative for dysuria, frequency, hematuria and urgency.  Musculoskeletal:  Positive for myalgias. Negative for back pain and neck pain.  Skin:  Negative for rash.  Allergic/Immunologic: Negative for  environmental allergies.  Neurological:  Negative for dizziness and headaches.  Hematological:  Does not bruise/bleed easily.  Psychiatric/Behavioral:  Negative for suicidal ideas. The patient is not nervous/anxious.    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   Acute back pain with sciatica 10/26/2014   Routine general medical examination at a health care facility 10/26/2014   Essential hypertension 10/26/2014   Adult BMI 30+ 10/26/2014    No Known Allergies  Past Surgical History:  Procedure Laterality Date   COLONOSCOPY WITH PROPOFOL N/A 01/16/2016   Procedure: COLONOSCOPY WITH PROPOFOL;  Surgeon: Midge Minium, MD;  Location: [facility];  Service: Endoscopy;  Laterality: N/A;   NASAL SINUS SURGERY     age 15 or 36    Social History   Tobacco Use   Smoking status: Never   Smokeless tobacco: Former    Types: Chew    Quit date: 06/10/2018   Tobacco comments:    Smokeless tobacco - occasionally (golfing, etc)  Substance Use Topics   Alcohol use: Yes    Alcohol/week: 6.0 standard drinks    Types: 6 Cans of beer per week   Drug use: No     Medication list  has been reviewed and updated.  Current Meds  Medication Sig   amLODipine (NORVASC) 2.5 MG tablet Take 1 tablet (2.5 mg total) by mouth daily.   ezetimibe (ZETIA) 10 MG tablet Take 1 tablet (10 mg total) by mouth daily.   fluticasone (FLONASE) 50 MCG/ACT nasal spray USE 1 SPRAY IN BOTH  NOSTRILS DAILY   loratadine (CLARITIN) 10 MG tablet Take 1 tablet (10 mg total) by mouth daily.   losartan (COZAAR) 100 MG tablet Take 1 tablet (100 mg total) by mouth daily.   PROCTOSOL HC 2.5 % rectal cream PLACE 1 APPLICATION  RECTALLY  TWICE DAILY   sertraline (ZOLOFT) 50 MG tablet Take 1 tablet (50 mg total) by mouth daily.    PHQ 2/9 Scores 08/26/2020 03/06/2020 01/18/2020 07/13/2019  PHQ - 2 Score 0 0 0 0  PHQ- 9 Score 0 0 0 2    GAD 7 : Generalized Anxiety Score 08/26/2020 03/06/2020 01/18/2020 07/13/2019  Nervous, Anxious, on Edge 0 0 0 0  Control/stop worrying 0 0 0 2  Worry too much - different things 0 0 0 3  Trouble relaxing 0 0 0 1  Restless 0 2 0 0  Easily annoyed or irritable 0 0 0 1  Afraid - awful might happen 0 0 0 1  Total GAD 7 Score 0 2 0 8  Anxiety Difficulty - Not difficult at all Not difficult at all Not difficult at all    BP Readings from Last 3 Encounters:  12/26/20 100/60  10/08/20 120/80  08/26/20 (!) 140/98    Physical Exam Vitals and nursing note reviewed.  HENT:     Head: Normocephalic.     Right Ear: Tympanic membrane, ear canal and external ear normal. There is no impacted cerumen.     Left Ear: Tympanic membrane, ear canal and external ear normal. There is no impacted cerumen.     Nose: Nose normal. No congestion or rhinorrhea.  Eyes:     General: No scleral icterus.       Right eye: No discharge.        Left eye: No discharge.     Conjunctiva/sclera: Conjunctivae normal.     Pupils: Pupils are equal, round, and reactive to light.  Neck:     Thyroid: No thyromegaly.     Vascular: No JVD.     Trachea: No tracheal deviation.  Cardiovascular:     Rate and Rhythm: Normal rate and regular rhythm.     Heart sounds: Normal heart sounds. No murmur heard.   No friction rub. No gallop.  Pulmonary:     Effort: No respiratory distress.     Breath sounds: Normal breath sounds. No wheezing, rhonchi or rales.  Abdominal:     General: Bowel sounds are normal.     Palpations: Abdomen is soft. There is no mass.     Tenderness: There is no abdominal tenderness. There is no guarding or rebound.  Musculoskeletal:        General: No tenderness. Normal range of motion.     Cervical back:  Normal range of motion and neck supple.  Lymphadenopathy:     Cervical: No cervical adenopathy.  Skin:    General: Skin is warm.     Findings: No rash.  Neurological:     Mental Status: He is alert and oriented to person, place, and time.     Cranial Nerves: No cranial nerve deficit.     Deep Tendon Reflexes: Reflexes are normal and symmetric.  Wt Readings from Last 3 Encounters:  12/26/20 263 lb (119.3 kg)  10/08/20 280 lb (127 kg)  08/26/20 275 lb (124.7 kg)    BP 100/60   Pulse (!) 112   Temp 98.6 F (37 C) (Oral)   Ht 6' (1.829 m)   Wt 263 lb (119.3 kg)   SpO2 93%   BMI 35.67 kg/m   Assessment and Plan:  1. Fever, unspecified fever cause New onset.  Persistent.  Complicated.  Patient began having some mild symptoms in retrospect late Saturday which worsened on Monday.  Shortness of breath fever and chills myalgias and nonproductive cough.  Although he had t2 home test there were negative patient had a video visit for which we treated him for influenza with Tamiflu and he is on day 5 for influenza today.  At this point in time we will continue to check diagnostics and that we need a PCR COVID with results within 24 to 48 hours t, CBC with differential, and chest x-ray.  Patient has been given multiple we are to go ahead and failed to start 200 mg 4 tablets twice a day for 5 days.  And we will recheck patient by phone tomorrow. - POCT Influenza A/B - DG Chest 2 View; Future - molnupiravir EUA 200 mg CAPS; Take 4 capsules (800 mg total) by mouth 2 (two) times daily for 5 days.  Dispense: 40 capsule; Refill: 0  2. Shortness of breath Noted above shortness of breath we will get a chest x-ray to rule out multifocal pneumonia.  Chest exam and auscultation is unremarkable but will proceed with caution in the event that this may be an unrecognized sequelae of COVID. - DG Chest 2 View; Future - molnupiravir EUA 200 mg CAPS; Take 4 capsules (800 mg total) by mouth 2 (two) times  daily for 5 days.  Dispense: 40 capsule; Refill: 0

## 2020-12-27 ENCOUNTER — Ambulatory Visit
Admission: RE | Admit: 2020-12-27 | Discharge: 2020-12-27 | Disposition: A | Discharge status: Home / Self Care | Payer: 59 | Source: Ambulatory Visit | Attending: Family Medicine | Admitting: Family Medicine

## 2020-12-27 ENCOUNTER — Ambulatory Visit
Admission: RE | Admit: 2020-12-27 | Discharge: 2020-12-27 | Disposition: A | Discharge status: Home / Self Care | Payer: 59 | Attending: Family Medicine | Admitting: Family Medicine

## 2020-12-27 ENCOUNTER — Other Ambulatory Visit: Payer: Self-pay

## 2020-12-27 DIAGNOSIS — R0602 Shortness of breath: Secondary | ICD-10-CM | POA: Diagnosis present

## 2020-12-27 DIAGNOSIS — J189 Pneumonia, unspecified organism: Secondary | ICD-10-CM

## 2020-12-27 DIAGNOSIS — R509 Fever, unspecified: Secondary | ICD-10-CM | POA: Insufficient documentation

## 2020-12-27 MED ORDER — AZITHROMYCIN 250 MG PO TABS: ORAL_TABLET | ORAL | 0 refills | Status: AC

## 2020-12-27 NOTE — Progress Notes (Signed)
Zpack sent in. 

## 2021-01-09 ENCOUNTER — Other Ambulatory Visit: Payer: Self-pay

## 2021-01-09 ENCOUNTER — Telehealth: Payer: Self-pay | Admitting: Family Medicine

## 2021-01-09 ENCOUNTER — Encounter: Payer: Self-pay | Admitting: Family Medicine

## 2021-01-09 ENCOUNTER — Ambulatory Visit: Payer: 59

## 2021-01-09 ENCOUNTER — Other Ambulatory Visit
Admission: RE | Admit: 2021-01-09 | Discharge: 2021-01-09 | Disposition: A | Discharge status: Home / Self Care | Payer: 59 | Source: Home / Self Care | Attending: Family Medicine | Admitting: Family Medicine

## 2021-01-09 ENCOUNTER — Ambulatory Visit
Admission: RE | Admit: 2021-01-09 | Discharge: 2021-01-09 | Disposition: A | Discharge status: Home / Self Care | Payer: 59 | Source: Ambulatory Visit | Attending: Family Medicine | Admitting: Family Medicine

## 2021-01-09 ENCOUNTER — Ambulatory Visit (INDEPENDENT_AMBULATORY_CARE_PROVIDER_SITE_OTHER): Payer: 59 | Admitting: Family Medicine

## 2021-01-09 ENCOUNTER — Ambulatory Visit
Admission: RE | Admit: 2021-01-09 | Discharge: 2021-01-09 | Disposition: A | Discharge status: Home / Self Care | Payer: 59 | Attending: Family Medicine | Admitting: Family Medicine

## 2021-01-09 VITALS — BP 98/62 | HR 124 | Temp 98.4°F | Ht 72.0 in | Wt 262.0 lb

## 2021-01-09 DIAGNOSIS — R0602 Shortness of breath: Secondary | ICD-10-CM

## 2021-01-09 DIAGNOSIS — J189 Pneumonia, unspecified organism: Secondary | ICD-10-CM | POA: Diagnosis not present

## 2021-01-09 DIAGNOSIS — R059 Cough, unspecified: Secondary | ICD-10-CM | POA: Diagnosis not present

## 2021-01-09 LAB — CBC WITH DIFFERENTIAL/PLATELET
Abs Immature Granulocytes: 0.02 10*3/uL (ref 0.00–0.07)
Basophils Absolute: 0.1 10*3/uL (ref 0.0–0.1)
Basophils Relative: 1 %
Eosinophils Absolute: 0.1 10*3/uL (ref 0.0–0.5)
Eosinophils Relative: 2 %
HCT: 45.1 % (ref 39.0–52.0)
Hemoglobin: 15.5 g/dL (ref 13.0–17.0)
Immature Granulocytes: 0 %
Lymphocytes Relative: 35 %
Lymphs Abs: 2.6 10*3/uL (ref 0.7–4.0)
MCH: 31.3 pg (ref 26.0–34.0)
MCHC: 34.4 g/dL (ref 30.0–36.0)
MCV: 91.1 fL (ref 80.0–100.0)
Monocytes Absolute: 0.3 10*3/uL (ref 0.1–1.0)
Monocytes Relative: 4 %
Neutro Abs: 4.5 10*3/uL (ref 1.7–7.7)
Neutrophils Relative %: 58 %
Platelets: 641 10*3/uL — ABNORMAL HIGH (ref 150–400)
RBC: 4.95 MIL/uL (ref 4.22–5.81)
RDW: 12.5 % (ref 11.5–15.5)
WBC: 7.7 10*3/uL (ref 4.0–10.5)
nRBC: 0 % (ref 0.0–0.2)

## 2021-01-09 MED ORDER — BENZONATATE 200 MG PO CAPS: 200.0000 mg | ORAL_CAPSULE | Freq: Two times a day (BID) | ORAL | 0 refills | Status: DC | PRN

## 2021-01-09 MED ORDER — DOXYCYCLINE HYCLATE 100 MG PO TABS
100.0000 mg | ORAL_TABLET | Freq: Two times a day (BID) | ORAL | 0 refills | Status: DC
Start: 2021-01-09 — End: 2021-05-13

## 2021-01-09 NOTE — Telephone Encounter (Signed)
Patient is calling for follow up to chest X-ray- no notes found in chart regarding follow up- call forwarded to office

## 2021-01-09 NOTE — Progress Notes (Signed)
Date:  01/09/2021   Name:  Erik Valdez   DOB:  01/04/1966   MRN:  [id number]   Chief Complaint: Cough (S/p pneumonia- needs recheck on chest xray and cough will not get better)  Cough This is a new problem. The current episode started 1 to 4 weeks ago (2 week). The problem has been gradually improving. The problem occurs every few minutes. The cough is Non-productive. Associated symptoms include shortness of breath. Pertinent negatives include no chest pain, chills, ear pain, fever, headaches, myalgias, nasal congestion, postnasal drip, rash, rhinorrhea, sore throat, sweats or wheezing. He has tried prescription cough suppressant for the symptoms. The treatment provided mild relief. There is no history of asthma, bronchiectasis, bronchitis, COPD or environmental allergies.   Lab Results  Component Value Date   CREATININE 1.08 08/27/2020   BUN 17 08/27/2020   NA 140 08/27/2020   K 4.4 08/27/2020   CL 101 08/27/2020   CO2 21 08/27/2020   Lab Results  Component Value Date   CHOL 184 08/27/2020   HDL 46 08/27/2020   LDLCALC 123 (H) 08/27/2020   TRIG 81 08/27/2020   CHOLHDL 5.1 (H) 01/05/2019   No results found for: TSH No results found for: HGBA1C Lab Results  Component Value Date   WBC 11.8 (H) 12/26/2020   HGB 14.4 12/26/2020   HCT 40.7 12/26/2020   MCV 88.3 12/26/2020   PLT 341 12/26/2020   Lab Results  Component Value Date   ALT 43 08/27/2020   AST 13 08/27/2020   ALKPHOS 103 08/27/2020   BILITOT 0.4 08/27/2020     Review of Systems  Constitutional:  Negative for chills and fever.  HENT:  Negative for drooling, ear discharge, ear pain, postnasal drip, rhinorrhea and sore throat.   Respiratory:  Positive for cough and shortness of breath. Negative for chest tightness and wheezing.   Cardiovascular:  Negative for chest pain, palpitations and leg swelling.  Gastrointestinal:  Negative for abdominal pain, blood in stool, constipation, diarrhea and nausea.   Endocrine: Negative for polydipsia.  Genitourinary:  Negative for dysuria, frequency, hematuria and urgency.  Musculoskeletal:  Negative for back pain, myalgias and neck pain.  Skin:  Negative for rash.  Allergic/Immunologic: Negative for environmental allergies.  Neurological:  Negative for dizziness and headaches.  Hematological:  Does not bruise/bleed easily.  Psychiatric/Behavioral:  Negative for suicidal ideas. The patient is not nervous/anxious.    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   Acute back pain with sciatica 10/26/2014   Routine general medical examination at a health care facility 10/26/2014   Essential hypertension 10/26/2014   Adult BMI 30+ 10/26/2014    No Known Allergies  Past Surgical History:  Procedure Laterality Date   COLONOSCOPY WITH PROPOFOL N/A 01/16/2016   Procedure: COLONOSCOPY WITH PROPOFOL;  Surgeon: Midge Minium, MD;  Location: [facility];  Service: Endoscopy;  Laterality: N/A;   NASAL SINUS SURGERY     age 19 or 76    Social History   Tobacco Use   Smoking status: Never   Smokeless tobacco: Former    Types: Chew    Quit date: 06/10/2018   Tobacco comments:    Smokeless tobacco - occasionally (  golfing, etc)  Substance Use Topics   Alcohol use: Yes    Alcohol/week: 6.0 standard drinks    Types: 6 Cans of beer per week   Drug use: No     Medication list has been reviewed and updated.  Current Meds  Medication Sig   amLODipine (NORVASC) 2.5 MG tablet Take 1 tablet (2.5 mg total) by mouth daily.   ezetimibe (ZETIA) 10 MG tablet Take 1 tablet (10 mg total) by mouth daily.   fluticasone (FLONASE) 50 MCG/ACT nasal spray USE 1 SPRAY IN BOTH   NOSTRILS DAILY   loratadine (CLARITIN) 10 MG tablet Take 1 tablet (10 mg total) by mouth daily.   losartan (COZAAR) 100 MG tablet Take 1 tablet (100 mg total) by mouth daily.   PROCTOSOL HC 2.5 % rectal cream PLACE 1 APPLICATION  RECTALLY TWICE DAILY   sertraline (ZOLOFT) 50 MG tablet Take 1 tablet (50 mg total) by mouth daily.    PHQ 2/9 Scores 01/09/2021 08/26/2020 03/06/2020 01/18/2020  PHQ - 2 Score 0 0 0 0  PHQ- 9 Score 0 0 0 0    GAD 7 : Generalized Anxiety Score 08/26/2020 03/06/2020 01/18/2020 07/13/2019  Nervous, Anxious, on Edge 0 0 0 0  Control/stop worrying 0 0 0 2  Worry too much - different things 0 0 0 3  Trouble relaxing 0 0 0 1  Restless 0 2 0 0  Easily annoyed or irritable 0 0 0 1  Afraid - awful might happen 0 0 0 1  Total GAD 7 Score 0 2 0 8  Anxiety Difficulty - Not difficult at all Not difficult at all Not difficult at all    BP Readings from Last 3 Encounters:  01/09/21 98/62  12/26/20 100/60  10/08/20 120/80    Physical Exam Vitals and nursing note reviewed.  HENT:     Head: Normocephalic.     Right Ear: Tympanic membrane, ear canal and external ear normal. There is no impacted cerumen.     Left Ear: Tympanic membrane, ear canal and external ear normal. There is no impacted cerumen.     Nose: Nose normal. No congestion or rhinorrhea.  Eyes:     General: No scleral icterus.       Right eye: No discharge.        Left eye: No discharge.     Conjunctiva/sclera: Conjunctivae normal.     Pupils: Pupils are equal, round, and reactive to light.  Neck:     Thyroid: No thyromegaly.     Vascular: No carotid bruit or JVD.     Trachea: No tracheal deviation.  Cardiovascular:     Rate and Rhythm: Normal rate and regular rhythm.     Heart sounds: Normal heart sounds. No murmur heard.   No friction rub. No gallop.  Pulmonary:     Effort: No respiratory distress.     Breath sounds: Normal breath sounds. No wheezing, rhonchi or rales.  Abdominal:     General: Bowel  sounds are normal.     Palpations: Abdomen is soft. There is no mass.     Tenderness: There is no abdominal tenderness. There is no guarding or rebound.  Musculoskeletal:        General: No tenderness. Normal range of motion.     Cervical back: Normal range of motion and neck supple.  Lymphadenopathy:     Cervical: No cervical adenopathy.  Skin:    General: Skin is warm.     Findings: No  rash.  Neurological:     Mental Status: He is alert and oriented to person, place, and time.     Cranial Nerves: No cranial nerve deficit.     Deep Tendon Reflexes: Reflexes are normal and symmetric.    Wt Readings from Last 3 Encounters:  01/09/21 262 lb (118.8 kg)  12/26/20 263 lb (119.3 kg)  10/08/20 280 lb (127 kg)    BP 98/62   Pulse (!) 124   Temp 98.4 F (36.9 C) (Oral)   Ht 6' (1.829 m)   Wt 262 lb (118.8 kg)   SpO2 96%   BMI 35.53 kg/m   Assessment and Plan:  1. Pneumonia of left lower lobe due to infectious organism New onset.  Gradually improving.  Patient with some cough we will treat with some doxycycline seen that there still may be some residual infection in the meantime chest x-ray was done it was noted to have no residual pneumonia. - DG Chest 2 View; Future - doxycycline (VIBRA-TABS) 100 MG tablet; Take 1 tablet (100 mg total) by mouth 2 (two) times daily.  Dispense: 20 tablet; Refill: 0  2. Shortness of breath Chronic.  Controlled.  Stable.  Chest x-ray was done and there is significant improvement of the previous pneumonia. - DG Chest 2 View; Future  3. Cough Patient has a persistent nonproductive cough.  Previous COVID's within the last 2 weeks have been negative we will treat with Tessalon Perles 200 mg 1 twice a day as needed for cough. - benzonatate (TESSALON) 200 MG capsule; Take 1 capsule (200 mg total) by mouth 2 (two) times daily as needed for cough.  Dispense: 20 capsule; Refill: 0

## 2021-01-09 NOTE — Telephone Encounter (Signed)
Patient called in asked to speak with Maralyn Sago the nurse about pneumonia followup. Please call back

## 2021-01-16 ENCOUNTER — Ambulatory Visit: Payer: Self-pay | Admitting: Family Medicine

## 2021-02-14 ENCOUNTER — Ambulatory Visit: Payer: Self-pay | Admitting: Family Medicine

## 2021-04-04 ENCOUNTER — Ambulatory Visit: Payer: Self-pay | Admitting: Family Medicine

## 2021-04-04 DIAGNOSIS — J301 Allergic rhinitis due to pollen: Secondary | ICD-10-CM

## 2021-04-04 DIAGNOSIS — E782 Mixed hyperlipidemia: Secondary | ICD-10-CM

## 2021-04-04 DIAGNOSIS — I10 Essential (primary) hypertension: Secondary | ICD-10-CM

## 2021-04-04 DIAGNOSIS — F329 Major depressive disorder, single episode, unspecified: Secondary | ICD-10-CM

## 2021-04-14 ENCOUNTER — Other Ambulatory Visit: Payer: Self-pay | Admitting: Family Medicine

## 2021-04-14 DIAGNOSIS — E782 Mixed hyperlipidemia: Secondary | ICD-10-CM

## 2021-04-15 NOTE — Telephone Encounter (Signed)
Pt was a No Show for last 3 appointments. Called pt and LM on VM to call office to schedule appt.  Last RF 10/08/20 #90 1 RF  Refill is due, med is on active med list. Routing back to office. Requested Prescriptions  Pending Prescriptions Disp Refills   amLODipine (NORVASC) 2.5 MG tablet [Pharmacy Med Name: amLODIPine Besylate 2.5 MG Oral Tablet] 90 tablet     Sig: TAKE 1 TABLET BY MOUTH  DAILY     Cardiovascular:  Calcium Channel Blockers Passed - 04/14/2021 10:38 PM      Passed - Last BP in normal range    BP Readings from Last 1 Encounters:  01/09/21 98/62          Passed - Valid encounter within last 6 months    Recent Outpatient Visits           3 months ago Pneumonia of left lower lobe due to infectious organism   [facility] Duanne Limerick, MD   3 months ago Fever, unspecified fever cause   [facility] Duanne Limerick, MD   6 months ago Essential hypertension   [facility] Duanne Limerick, MD   7 months ago Essential hypertension   [facility] Duanne Limerick, MD   1 year ago Mixed hyperlipidemia   [facility] Duanne Limerick, MD              Signed Prescriptions Disp Refills   ezetimibe (ZETIA) 10 MG tablet 90 tablet 1    Sig: TAKE 1 TABLET BY MOUTH  DAILY     Cardiovascular:  Antilipid - Sterol Transport Inhibitors Failed - 04/14/2021 10:38 PM      Failed - LDL in normal range and within 360 days    LDL Chol Calc (NIH)  Date Value Ref Range Status  08/27/2020 123 (H) 0 - 99 mg/dL Final          Passed - Total Cholesterol in normal range and within 360 days    Cholesterol, Total  Date Value Ref Range Status  08/27/2020 184 100 - 199 mg/dL Final          Passed - HDL in normal range and within 360 days    HDL  Date Value Ref Range Status  08/27/2020 46 >39 mg/dL Final          Passed - Triglycerides in normal range and within 360 days    Triglycerides  Date Value Ref Range Status  08/27/2020  81 0 - 149 mg/dL Final          Passed - Valid encounter within last 12 months    Recent Outpatient Visits           3 months ago Pneumonia of left lower lobe due to infectious organism   [facility] Duanne Limerick, MD   3 months ago Fever, unspecified fever cause   [facility] Duanne Limerick, MD   6 months ago Essential hypertension   [facility] Duanne Limerick, MD   7 months ago Essential hypertension   [facility] Duanne Limerick, MD   1 year ago Mixed hyperlipidemia   [facility] Duanne Limerick, MD              '

## 2021-04-15 NOTE — Telephone Encounter (Signed)
Requested Prescriptions  Pending Prescriptions Disp Refills  . ezetimibe (ZETIA) 10 MG tablet [Pharmacy Med Name: Ezetimibe 10 MG Oral Tablet] 90 tablet 1    Sig: TAKE 1 TABLET BY MOUTH  DAILY     Cardiovascular:  Antilipid - Sterol Transport Inhibitors Failed - 04/14/2021 10:38 PM      Failed - LDL in normal range and within 360 days    LDL Chol Calc (NIH)  Date Value Ref Range Status  08/27/2020 123 (H) 0 - 99 mg/dL Final         Passed - Total Cholesterol in normal range and within 360 days    Cholesterol, Total  Date Value Ref Range Status  08/27/2020 184 100 - 199 mg/dL Final         Passed - HDL in normal range and within 360 days    HDL  Date Value Ref Range Status  08/27/2020 46 >39 mg/dL Final         Passed - Triglycerides in normal range and within 360 days    Triglycerides  Date Value Ref Range Status  08/27/2020 81 0 - 149 mg/dL Final         Passed - Valid encounter within last 12 months    Recent Outpatient Visits          3 months ago Pneumonia of left lower lobe due to infectious organism   [facility] Duanne Limerick, MD   3 months ago Fever, unspecified fever cause   [facility] Duanne Limerick, MD   6 months ago Essential hypertension   [facility] Duanne Limerick, MD   7 months ago Essential hypertension   [facility] Duanne Limerick, MD   1 year ago Mixed hyperlipidemia   [facility] Duanne Limerick, MD             . amLODipine (NORVASC) 2.5 MG tablet [Pharmacy Med Name: amLODIPine Besylate 2.5 MG Oral Tablet] 90 tablet     Sig: TAKE 1 TABLET BY MOUTH  DAILY     Cardiovascular:  Calcium Channel Blockers Passed - 04/14/2021 10:38 PM      Passed - Last BP in normal range    BP Readings from Last 1 Encounters:  01/09/21 98/62         Passed - Valid encounter within last 6 months    Recent Outpatient Visits          3 months ago Pneumonia of left lower lobe due to infectious organism    [facility] Duanne Limerick, MD   3 months ago Fever, unspecified fever cause   [facility] Duanne Limerick, MD   6 months ago Essential hypertension   [facility] Duanne Limerick, MD   7 months ago Essential hypertension   [facility] Duanne Limerick, MD   1 year ago Mixed hyperlipidemia   [facility] Duanne Limerick, MD

## 2021-05-13 ENCOUNTER — Ambulatory Visit (INDEPENDENT_AMBULATORY_CARE_PROVIDER_SITE_OTHER): Payer: 59 | Admitting: Family Medicine

## 2021-05-13 ENCOUNTER — Encounter: Payer: Self-pay | Admitting: Family Medicine

## 2021-05-13 ENCOUNTER — Other Ambulatory Visit: Payer: Self-pay

## 2021-05-13 VITALS — BP 118/84 | HR 108 | Ht 72.0 in | Wt 272.0 lb

## 2021-05-13 DIAGNOSIS — J301 Allergic rhinitis due to pollen: Secondary | ICD-10-CM | POA: Diagnosis not present

## 2021-05-13 DIAGNOSIS — H1013 Acute atopic conjunctivitis, bilateral: Secondary | ICD-10-CM

## 2021-05-13 DIAGNOSIS — I10 Essential (primary) hypertension: Secondary | ICD-10-CM

## 2021-05-13 DIAGNOSIS — F329 Major depressive disorder, single episode, unspecified: Secondary | ICD-10-CM | POA: Diagnosis not present

## 2021-05-13 DIAGNOSIS — E782 Mixed hyperlipidemia: Secondary | ICD-10-CM | POA: Diagnosis not present

## 2021-05-13 MED ORDER — AMLODIPINE BESYLATE 2.5 MG PO TABS: 2.5000 mg | ORAL_TABLET | Freq: Every day | ORAL | 0 refills | Status: DC

## 2021-05-13 MED ORDER — SERTRALINE HCL 50 MG PO TABS: 75.0000 mg | ORAL_TABLET | Freq: Every day | ORAL | 1 refills | Status: DC

## 2021-05-13 MED ORDER — LORATADINE 10 MG PO TABS: 10.0000 mg | ORAL_TABLET | Freq: Every day | ORAL | 1 refills | Status: DC

## 2021-05-13 MED ORDER — AMLODIPINE BESYLATE 2.5 MG PO TABS: 2.5000 mg | ORAL_TABLET | Freq: Every day | ORAL | 1 refills | Status: DC

## 2021-05-13 MED ORDER — LOSARTAN POTASSIUM 100 MG PO TABS: 100.0000 mg | ORAL_TABLET | Freq: Every day | ORAL | 1 refills | Status: DC

## 2021-05-13 MED ORDER — SERTRALINE HCL 50 MG PO TABS: 50.0000 mg | ORAL_TABLET | Freq: Every day | ORAL | 1 refills | Status: DC

## 2021-05-13 MED ORDER — EZETIMIBE 10 MG PO TABS: 10.0000 mg | ORAL_TABLET | Freq: Every day | ORAL | 1 refills | Status: DC

## 2021-05-13 MED ORDER — SERTRALINE HCL 50 MG PO TABS: 50.0000 mg | ORAL_TABLET | Freq: Every day | ORAL | 0 refills | Status: DC

## 2021-05-13 MED ORDER — OLOPATADINE HCL 0.1 % OP SOLN: 1.0000 [drp] | Freq: Two times a day (BID) | OPHTHALMIC | 12 refills | Status: DC

## 2021-05-13 NOTE — Progress Notes (Signed)
Date:  05/13/2021   Name:  Erik Valdez   DOB:  31-Mar-1966   MRN:  [id number]   Chief Complaint: Follow-up  Hypertension This is a chronic problem. The current episode started more than 1 year ago. The problem has been gradually improving since onset. The problem is controlled. Pertinent negatives include no anxiety, blurred vision, chest pain, headaches, malaise/fatigue, neck pain, orthopnea, palpitations, peripheral edema, PND, shortness of breath or sweats. There are no associated agents to hypertension. There are no known risk factors for coronary artery disease. Past treatments include angiotensin blockers and calcium channel blockers. There are no compliance problems.  There is no history of angina, kidney disease, CAD/MI, CVA, heart failure, left ventricular hypertrophy, PVD or retinopathy. There is no history of chronic renal disease, a hypertension causing med or renovascular disease.  Depression        This is a chronic problem.  The current episode started more than 1 year ago.   The onset quality is gradual.   The problem occurs intermittently.  The problem has been gradually improving since onset.  Associated symptoms include no decreased concentration, no fatigue, no helplessness, no hopelessness, does not have insomnia, not irritable, no restlessness, no decreased interest, no appetite change, no body aches, no myalgias, no headaches, no indigestion, not sad and no suicidal ideas.  Past treatments include SSRIs - Selective serotonin reuptake inhibitors.   Pertinent negatives include no anxiety.  Lab Results  Component Value Date   CREATININE 1.08 08/27/2020   BUN 17 08/27/2020   NA 140 08/27/2020   K 4.4 08/27/2020   CL 101 08/27/2020   CO2 21 08/27/2020   Lab Results  Component Value Date   CHOL 184 08/27/2020   HDL 46 08/27/2020   LDLCALC 123 (H) 08/27/2020   TRIG 81 08/27/2020   CHOLHDL 5.1 (H) 01/05/2019   No results found for: TSH No results found for:  HGBA1C Lab Results  Component Value Date   WBC 7.7 01/09/2021   HGB 15.5 01/09/2021   HCT 45.1 01/09/2021   MCV 91.1 01/09/2021   PLT 641 (H) 01/09/2021   Lab Results  Component Value Date   ALT 43 08/27/2020   AST 13 08/27/2020   ALKPHOS 103 08/27/2020   BILITOT 0.4 08/27/2020   No components found for: VITD  Review of Systems  Constitutional:  Negative for appetite change, chills, fatigue, fever and malaise/fatigue.  HENT:  Negative for drooling, ear discharge, ear pain and sore throat.   Eyes:  Negative for blurred vision.  Respiratory:  Negative for cough, shortness of breath and wheezing.   Cardiovascular:  Negative for chest pain, palpitations, orthopnea, leg swelling and PND.  Gastrointestinal:  Negative for abdominal pain, blood in stool, constipation, diarrhea and nausea.  Endocrine: Negative for polydipsia.  Genitourinary:  Negative for dysuria, frequency, hematuria and urgency.  Musculoskeletal:  Negative for back pain, myalgias and neck pain.  Skin:  Negative for rash.  Allergic/Immunologic: Negative for environmental allergies.  Neurological:  Negative for dizziness and headaches.  Hematological:  Does not bruise/bleed easily.  Psychiatric/Behavioral:  Positive for depression. Negative for decreased concentration and suicidal ideas. The patient is not nervous/anxious and does not have insomnia.    Patient Active Problem List   Diagnosis Date Noted   Reactive depression 07/13/2019   Anxiety 07/13/2019   Pure hypercholesterolemia 10/13/2017   External hemorrhoid 09/28/2016   Mixed hyperlipidemia 09/28/2016   Chronic seasonal allergic rhinitis due to pollen 09/28/2016  Blood in stool    Laboratory animal allergy 10/26/2014   Acute back pain with sciatica 10/26/2014   Routine general medical examination at a health care facility 10/26/2014   Essential hypertension 10/26/2014   Adult BMI 30+ 10/26/2014    No Known Allergies  Past Surgical History:   Procedure Laterality Date   COLONOSCOPY WITH PROPOFOL N/A 01/16/2016   Procedure: COLONOSCOPY WITH PROPOFOL;  Surgeon: Midge Minium, MD;  Location: [facility];  Service: Endoscopy;  Laterality: N/A;   NASAL SINUS SURGERY     age 5 or 8    Social History   Tobacco Use   Smoking status: Never   Smokeless tobacco: Former    Types: Chew    Quit date: 06/10/2018   Tobacco comments:    Smokeless tobacco - occasionally (golfing, etc)  Vaping Use   Vaping Use: Never used  Substance Use Topics   Alcohol use: Yes    Alcohol/week: 6.0 standard drinks    Types: 6 Cans of beer per week   Drug use: Never     Medication list has been reviewed and updated.  Current Meds  Medication Sig   fluticasone (FLONASE) 50 MCG/ACT nasal spray USE 1 SPRAY IN BOTH  NOSTRILS DAILY   PROCTOSOL HC 2.5 % rectal cream PLACE 1 APPLICATION  RECTALLY TWICE DAILY   [DISCONTINUED] amLODipine (NORVASC) 2.5 MG tablet Take 1 tablet (2.5 mg total) by mouth daily.   [DISCONTINUED] ezetimibe (ZETIA) 10 MG tablet TAKE 1 TABLET BY MOUTH  DAILY   [DISCONTINUED] loratadine (CLARITIN) 10 MG tablet Take 1 tablet (10 mg total) by mouth daily.   [DISCONTINUED] losartan (COZAAR) 100 MG tablet Take 1 tablet (100 mg total) by mouth daily.   [DISCONTINUED] sertraline (ZOLOFT) 50 MG tablet Take 1 tablet (50 mg total) by mouth daily.    PHQ 2/9 Scores 01/09/2021 08/26/2020 03/06/2020 01/18/2020  PHQ - 2 Score 0 0 0 0  PHQ- 9 Score 0 0 0 0    GAD 7 : Generalized Anxiety Score 08/26/2020 03/06/2020 01/18/2020 07/13/2019  Nervous, Anxious, on Edge 0 0 0 0  Control/stop worrying 0 0 0 2  Worry too much - different things 0 0 0 3  Trouble relaxing 0 0 0 1  Restless 0 2 0 0  Easily annoyed or irritable 0 0 0 1  Afraid - awful might happen 0 0 0 1  Total GAD 7 Score 0 2 0 8  Anxiety Difficulty - Not difficult at all Not difficult at all Not difficult at all    BP Readings from Last 3 Encounters:  05/13/21 118/84  01/09/21  98/62  12/26/20 100/60    Physical Exam Vitals and nursing note reviewed.  Constitutional:      General: He is not irritable. HENT:     Head: Normocephalic.     Right Ear: Tympanic membrane, ear canal and external ear normal.     Left Ear: Tympanic membrane, ear canal and external ear normal.     Nose: Nose normal.  Eyes:     General: Lids are normal. Lids are everted, no foreign bodies appreciated. Vision grossly intact. Gaze aligned appropriately. No scleral icterus.       Right eye: No discharge.        Left eye: No discharge.     Extraocular Movements: Extraocular movements intact.     Conjunctiva/sclera:     Right eye: Right conjunctiva is injected.     Left eye: Left conjunctiva is injected.  Pupils: Pupils are equal, round, and reactive to light.     Comments: No foreign body/conjunctiva erythema  Neck:     Thyroid: No thyromegaly.     Vascular: No JVD.     Trachea: No tracheal deviation.  Cardiovascular:     Rate and Rhythm: Normal rate and regular rhythm.     Heart sounds: Normal heart sounds. No murmur heard.   No friction rub. No gallop.  Pulmonary:     Effort: No respiratory distress.     Breath sounds: Normal breath sounds. No wheezing or rales.  Abdominal:     General: Bowel sounds are normal.     Palpations: Abdomen is soft. There is no mass.     Tenderness: There is no abdominal tenderness. There is no guarding or rebound.  Musculoskeletal:        General: No tenderness. Normal range of motion.     Cervical back: Normal range of motion and neck supple.  Lymphadenopathy:     Cervical: No cervical adenopathy.  Skin:    General: Skin is warm.     Findings: No rash.  Neurological:     Mental Status: He is alert and oriented to person, place, and time.     Cranial Nerves: No cranial nerve deficit.     Deep Tendon Reflexes: Reflexes are normal and symmetric.    Wt Readings from Last 3 Encounters:  05/13/21 272 lb (123.4 kg)  01/09/21 262 lb (118.8  kg)  12/26/20 263 lb (119.3 kg)    BP 118/84 (BP Location: Right Arm, Patient Position: Sitting, Cuff Size: Large)   Pulse (!) 108   Ht 6' (1.829 m)   Wt 272 lb (123.4 kg)   SpO2 95%   BMI 36.89 kg/m   Assessment and Plan:  1. Reactive depression Chronic.  Controlled.  Stable.  PHQ is 2 Gad score is 9 therefore we will increase sertraline to 75 mg once a day and will recheck patient in 4 months. - sertraline (ZOLOFT) 50 MG tablet; Take 1.5 tablets (75 mg total) by mouth daily.  Dispense: 135 tablet; Refill: 1  2. Essential hypertension Chronic.  Controlled.  Stable.  Blood pressure today is 118/84.  Continue losartan 100 mg once a day and amlodipine 2.5 mg once a day. - losartan (COZAAR) 100 MG tablet; Take 1 tablet (100 mg total) by mouth daily.  Dispense: 90 tablet; Refill: 1 - amLODipine (NORVASC) 2.5 MG tablet; Take 1 tablet (2.5 mg total) by mouth daily.  Dispense: 90 tablet; Refill: 1  3. Mixed hyperlipidemia Chronic.  Controlled.  Stable.  Continue Zetia 10 mg once a day. - ezetimibe (ZETIA) 10 MG tablet; Take 1 tablet (10 mg total) by mouth daily.  Dispense: 90 tablet; Refill: 1  4. Chronic seasonal allergic rhinitis due to pollen Chronic seasonal allergic conjunctivitis due to pollen Current exacerbation with allergic conjunctivitis as noted below we will continue loratadine 10 mg once a day and I have add  Patanol eyedrops to the regimen. - loratadine (CLARITIN) 10 MG tablet; Take 1 tablet (10 mg total) by mouth daily.  Dispense: 90 tablet; Refill: 1  5. Allergic conjunctivitis of both eyes New onset.  Persistent.  Right greater than the left.  Erythematous of the conjunctiva.  This is consistent with his allergic conjunctivitis rhinitis circumstance about this time a year and we will treat with Patanol eyedrops.

## 2021-07-22 ENCOUNTER — Telehealth: Payer: Self-pay | Admitting: Family Medicine

## 2021-07-22 NOTE — Telephone Encounter (Signed)
Copied from CRM [id number]. Topic: General - Other >> Jul 22, 2021  8:52 AM McGill, Darlina Rumpf wrote: Reason for CRM: Pt requested a call back from [place], but declined to provide any further details.

## 2021-09-10 ENCOUNTER — Ambulatory Visit (INDEPENDENT_AMBULATORY_CARE_PROVIDER_SITE_OTHER): Payer: 59 | Admitting: Family Medicine

## 2021-09-10 ENCOUNTER — Other Ambulatory Visit: Payer: Self-pay

## 2021-09-10 ENCOUNTER — Encounter: Payer: Self-pay | Admitting: Family Medicine

## 2021-09-10 VITALS — BP 120/80 | HR 80 | Ht 72.0 in | Wt 276.0 lb

## 2021-09-10 DIAGNOSIS — I10 Essential (primary) hypertension: Secondary | ICD-10-CM

## 2021-09-10 DIAGNOSIS — F329 Major depressive disorder, single episode, unspecified: Secondary | ICD-10-CM

## 2021-09-10 DIAGNOSIS — E782 Mixed hyperlipidemia: Secondary | ICD-10-CM | POA: Diagnosis not present

## 2021-09-10 DIAGNOSIS — J301 Allergic rhinitis due to pollen: Secondary | ICD-10-CM | POA: Diagnosis not present

## 2021-09-10 MED ORDER — LORATADINE 10 MG PO TABS: 10.0000 mg | ORAL_TABLET | Freq: Every day | ORAL | 1 refills | Status: DC

## 2021-09-10 MED ORDER — AMLODIPINE BESYLATE 2.5 MG PO TABS: 2.5000 mg | ORAL_TABLET | Freq: Every day | ORAL | 1 refills | Status: DC

## 2021-09-10 MED ORDER — SERTRALINE HCL 50 MG PO TABS: 75.0000 mg | ORAL_TABLET | Freq: Every day | ORAL | 1 refills | Status: DC

## 2021-09-10 MED ORDER — LOSARTAN POTASSIUM 100 MG PO TABS: 100.0000 mg | ORAL_TABLET | Freq: Every day | ORAL | 1 refills | Status: DC

## 2021-09-10 MED ORDER — EZETIMIBE 10 MG PO TABS: 10.0000 mg | ORAL_TABLET | Freq: Every day | ORAL | 1 refills | Status: DC

## 2021-09-10 MED ORDER — FLUTICASONE PROPIONATE 50 MCG/ACT NA SUSP: NASAL | 1 refills | Status: DC

## 2021-09-10 NOTE — Progress Notes (Signed)
Date:  09/10/2021   Name:  Erik Valdez   DOB:  05/03/1966   MRN:  [id number]   Chief Complaint: Hypertension, Allergic Rhinitis , Depression, and Hyperlipidemia  Hypertension This is a chronic problem. The current episode started more than 1 year ago. The problem has been gradually improving since onset. The problem is controlled. Pertinent negatives include no anxiety, blurred vision, chest pain, headaches, malaise/fatigue, neck pain, orthopnea, palpitations, peripheral edema, PND, shortness of breath or sweats. Risk factors for coronary artery disease include dyslipidemia. Past treatments include angiotensin blockers and diuretics. The current treatment provides moderate improvement. There are no compliance problems.  There is no history of angina, kidney disease, CAD/MI, CVA, heart failure, left ventricular hypertrophy, PVD or retinopathy. There is no history of chronic renal disease, a hypertension causing med or renovascular disease.  Depression        This is a chronic problem.  The current episode started more than 1 year ago.   The onset quality is gradual.   The problem occurs intermittently.  The problem has been gradually improving since onset.  Associated symptoms include no decreased concentration, no fatigue, no helplessness, no hopelessness, does not have insomnia, not irritable, no restlessness, no decreased interest, no appetite change, no body aches, no myalgias, no headaches, no indigestion, not sad and no suicidal ideas.  Past treatments include SSRIs - Selective serotonin reuptake inhibitors.  Compliance with treatment is good.  Previous treatment provided mild relief.   Pertinent negatives include no hypothyroidism and no anxiety. Hyperlipidemia This is a chronic problem. The current episode started more than 1 year ago. The problem is controlled. Recent lipid tests were reviewed and are normal. He has no history of chronic renal disease, diabetes, hypothyroidism, liver  disease, obesity or nephrotic syndrome. Pertinent negatives include no chest pain, focal sensory loss, focal weakness, leg pain, myalgias or shortness of breath. He is currently on no antihyperlipidemic treatment. The current treatment provides moderate improvement of lipids. There are no compliance problems.  Risk factors for coronary artery disease include dyslipidemia, hypertension, male sex and obesity.  URI  This is a chronic problem. The problem has been gradually improving. Pertinent negatives include no abdominal pain, chest pain, congestion, coughing, diarrhea, dysuria, ear pain, headaches, joint pain, joint swelling, nausea, neck pain, plugged ear sensation, rash, rhinorrhea, sinus pain, sneezing, sore throat, swollen glands, vomiting or wheezing. The treatment provided moderate relief.   Lab Results  Component Value Date   NA 140 08/27/2020   K 4.4 08/27/2020   CO2 21 08/27/2020   GLUCOSE 105 (H) 08/27/2020   BUN 17 08/27/2020   CREATININE 1.08 08/27/2020   CALCIUM 9.2 08/27/2020   EGFR 82 08/27/2020   GFRNONAA 92 03/06/2020   Lab Results  Component Value Date   CHOL 184 08/27/2020   HDL 46 08/27/2020   LDLCALC 123 (H) 08/27/2020   TRIG 81 08/27/2020   CHOLHDL 5.1 (H) 01/05/2019   No results found for: TSH No results found for: HGBA1C Lab Results  Component Value Date   WBC 7.7 01/09/2021   HGB 15.5 01/09/2021   HCT 45.1 01/09/2021   MCV 91.1 01/09/2021   PLT 641 (H) 01/09/2021   Lab Results  Component Value Date   ALT 43 08/27/2020   AST 13 08/27/2020   ALKPHOS 103 08/27/2020   BILITOT 0.4 08/27/2020   No results found for: 25OHVITD2, 25OHVITD3, VD25OH   Review of Systems  Constitutional:  Negative for appetite change, chills, fatigue,  fever and malaise/fatigue.  HENT:  Negative for congestion, drooling, ear discharge, ear pain, rhinorrhea, sinus pain, sneezing and sore throat.   Eyes:  Negative for blurred vision.  Respiratory:  Negative for cough,  shortness of breath and wheezing.   Cardiovascular:  Negative for chest pain, palpitations, orthopnea, leg swelling and PND.  Gastrointestinal:  Negative for abdominal pain, blood in stool, constipation, diarrhea, nausea and vomiting.  Endocrine: Negative for polydipsia.  Genitourinary:  Negative for dysuria, frequency, hematuria and urgency.  Musculoskeletal:  Negative for back pain, joint pain, myalgias and neck pain.  Skin:  Negative for rash.  Allergic/Immunologic: Negative for environmental allergies.  Neurological:  Negative for dizziness, focal weakness and headaches.  Hematological:  Does not bruise/bleed easily.  Psychiatric/Behavioral:  Positive for depression. Negative for decreased concentration and suicidal ideas. The patient is not nervous/anxious and does not have insomnia.    Patient Active Problem List   Diagnosis Date Noted   Reactive depression 07/13/2019   Anxiety 07/13/2019   Pure hypercholesterolemia 10/13/2017   Stable angina pectoris (HCC) 10/01/2016   External hemorrhoid 09/28/2016   Mixed hyperlipidemia 09/28/2016   Chronic seasonal allergic rhinitis due to pollen 09/28/2016   Blood in stool    Laboratory animal allergy 10/26/2014   Acute back pain with sciatica 10/26/2014   Routine general medical examination at a health care facility 10/26/2014   Essential hypertension 10/26/2014   Adult BMI 30+ 10/26/2014    No Known Allergies  Past Surgical History:  Procedure Laterality Date   COLONOSCOPY WITH PROPOFOL N/A 01/16/2016   Procedure: COLONOSCOPY WITH PROPOFOL;  Surgeon: Midge Minium, MD;  Location: [facility];  Service: Endoscopy;  Laterality: N/A;   NASAL SINUS SURGERY     age 58 or 8    Social History   Tobacco Use   Smoking status: Never   Smokeless tobacco: Former    Types: Chew    Quit date: 06/10/2018   Tobacco comments:    Smokeless tobacco - occasionally (golfing, etc)  Vaping Use   Vaping Use: Never used  Substance Use Topics    Alcohol use: Yes    Alcohol/week: 6.0 standard drinks    Types: 6 Cans of beer per week   Drug use: Never     Medication list has been reviewed and updated.  Current Meds  Medication Sig   amLODipine (NORVASC) 2.5 MG tablet Take 1 tablet (2.5 mg total) by mouth daily.   ezetimibe (ZETIA) 10 MG tablet Take 1 tablet (10 mg total) by mouth daily.   fluticasone (FLONASE) 50 MCG/ACT nasal spray USE 1 SPRAY IN BOTH  NOSTRILS DAILY   loratadine (CLARITIN) 10 MG tablet Take 1 tablet (10 mg total) by mouth daily.   losartan (COZAAR) 100 MG tablet Take 1 tablet (100 mg total) by mouth daily.   PROCTOSOL HC 2.5 % rectal cream PLACE 1 APPLICATION  RECTALLY TWICE DAILY   sertraline (ZOLOFT) 50 MG tablet Take 1.5 tablets (75 mg total) by mouth daily.   [DISCONTINUED] olopatadine (PATANOL) 0.1 % ophthalmic solution Place 1 drop into both eyes 2 (two) times daily.       09/10/2021    9:27 AM 05/13/2021    8:51 AM 01/09/2021    1:16 PM 08/26/2020   10:35 AM  PHQ 2/9 Scores  PHQ - 2 Score 0 0 0 0  PHQ- 9 Score 0 2 0 0       09/10/2021    9:27 AM 05/13/2021    8:51  AM 08/26/2020   10:35 AM 03/06/2020    8:14 AM  GAD 7 : Generalized Anxiety Score  Nervous, Anxious, on Edge 0 1 0 0  Control/stop worrying 0 1 0 0  Worry too much - different things 0 1 0 0  Trouble relaxing 0 1 0 0  Restless 0 3 0 2  Easily annoyed or irritable 0 2 0 0  Afraid - awful might happen 0 0 0 0  Total GAD 7 Score 0 9 0 2  Anxiety Difficulty Not difficult at all Somewhat difficult  Not difficult at all    BP Readings from Last 3 Encounters:  09/10/21 120/80  05/13/21 118/84  01/09/21 98/62    Physical Exam Vitals and nursing note reviewed.  Constitutional:      General: He is not irritable. HENT:     Head: Normocephalic.     Right Ear: Tympanic membrane, ear canal and external ear normal.     Left Ear: Tympanic membrane, ear canal and external ear normal.     Nose: Nose normal. No congestion or  rhinorrhea.  Eyes:     General: No scleral icterus.       Right eye: No discharge.        Left eye: No discharge.     Conjunctiva/sclera: Conjunctivae normal.     Pupils: Pupils are equal, round, and reactive to light.  Neck:     Thyroid: No thyromegaly.     Vascular: No JVD.     Trachea: No tracheal deviation.  Cardiovascular:     Rate and Rhythm: Normal rate and regular rhythm.     Heart sounds: Normal heart sounds. No murmur heard.   No friction rub. No gallop.  Pulmonary:     Effort: No respiratory distress.     Breath sounds: Normal breath sounds. No wheezing, rhonchi or rales.  Abdominal:     General: Bowel sounds are normal.     Palpations: Abdomen is soft. There is no mass.     Tenderness: There is no abdominal tenderness. There is no guarding or rebound.  Musculoskeletal:        General: No tenderness. Normal range of motion.     Cervical back: Normal range of motion and neck supple.  Lymphadenopathy:     Cervical: No cervical adenopathy.  Skin:    General: Skin is warm.     Findings: No rash.  Neurological:     Mental Status: He is alert and oriented to person, place, and time.     Cranial Nerves: No cranial nerve deficit.     Deep Tendon Reflexes: Reflexes are normal and symmetric.    Wt Readings from Last 3 Encounters:  09/10/21 276 lb (125.2 kg)  05/13/21 272 lb (123.4 kg)  01/09/21 262 lb (118.8 kg)    BP 120/80   Pulse 80   Ht 6' (1.829 m)   Wt 276 lb (125.2 kg)   BMI 37.43 kg/m   Assessment and Plan: 1. Essential hypertension Chronic.  Controlled.  Stable.  Blood pressure 120/80.  Continue amlodipine 2.5 mg once a day and losartan 100 mg once a day.  Will check CMP for electrolytes and GFR. - amLODipine (NORVASC) 2.5 MG tablet; Take 1 tablet (2.5 mg total) by mouth daily.  Dispense: 90 tablet; Refill: 1 - losartan (COZAAR) 100 MG tablet; Take 1 tablet (100 mg total) by mouth daily.  Dispense: 90 tablet; Refill: 1 - Comprehensive Metabolic Panel  (CMET)  2. Mixed hyperlipidemia  Chronic.  Controlled.  Stable.  Continue with dietary approach and paired with Zetia 10 mg once a day.  Will check lipid panel for status of LDL and comprehensive metabolic panel to rule out hepatic concerns. - ezetimibe (ZETIA) 10 MG tablet; Take 1 tablet (10 mg total) by mouth daily.  Dispense: 90 tablet; Refill: 1 - Lipid Panel With LDL/HDL Ratio - Comprehensive Metabolic Panel (CMET)  3. Chronic seasonal allergic rhinitis due to pollen Chronic.  Episodic.  Worse at this time given the high pollen counts.  Patient will continue with combination nasal steroid and nonsedating antihistamine of choice. - fluticasone (FLONASE) 50 MCG/ACT nasal spray; USE 1 SPRAY IN BOTH  NOSTRILS DAILY  Dispense: 32 g; Refill: 1 - loratadine (CLARITIN) 10 MG tablet; Take 1 tablet (10 mg total) by mouth daily.  Dispense: 90 tablet; Refill: 1  4. Reactive depression Chronic.  Controlled.  Stable.  PHQ is 0 Gad score is 0.  We will continue sertraline 75 mg 1-1/2 tablets once a day and will recheck in 6 months.  We did discuss the pitiful season that [facility] had and agreed that if anything would induce a depression state the season would and we seem to have tolerated it well both of [place]. - sertraline (ZOLOFT) 50 MG tablet; Take 1.5 tablets (75 mg total) by mouth daily.  Dispense: 135 tablet; Refill: 1

## 2021-09-11 LAB — COMPREHENSIVE METABOLIC PANEL
ALT: 28 IU/L (ref 0–44)
AST: 19 IU/L (ref 0–40)
Albumin/Globulin Ratio: 1.9 (ref 1.2–2.2)
Albumin: 4.5 g/dL (ref 3.8–4.9)
Alkaline Phosphatase: 101 IU/L (ref 44–121)
BUN/Creatinine Ratio: 14 (ref 9–20)
BUN: 12 mg/dL (ref 6–24)
Bilirubin Total: 0.4 mg/dL (ref 0.0–1.2)
CO2: 22 mmol/L (ref 20–29)
Calcium: 10 mg/dL (ref 8.7–10.2)
Chloride: 103 mmol/L (ref 96–106)
Creatinine, Ser: 0.85 mg/dL (ref 0.76–1.27)
Globulin, Total: 2.4 g/dL (ref 1.5–4.5)
Glucose: 130 mg/dL — ABNORMAL HIGH (ref 70–99)
Potassium: 4.9 mmol/L (ref 3.5–5.2)
Sodium: 141 mmol/L (ref 134–144)
Total Protein: 6.9 g/dL (ref 6.0–8.5)
eGFR: 103 mL/min/{1.73_m2} (ref >59)

## 2021-09-11 LAB — LIPID PANEL WITH LDL/HDL RATIO
Cholesterol, Total: 204 mg/dL — ABNORMAL HIGH (ref 100–199)
HDL: 41 mg/dL (ref >39)
LDL Chol Calc (NIH): 137 mg/dL — ABNORMAL HIGH (ref 0–99)
LDL/HDL Ratio: 3.3 ratio (ref 0.0–3.6)
Triglycerides: 146 mg/dL (ref 0–149)
VLDL Cholesterol Cal: 26 mg/dL (ref 5–40)

## 2021-10-27 ENCOUNTER — Other Ambulatory Visit: Payer: Self-pay | Admitting: Family Medicine

## 2021-10-27 DIAGNOSIS — I10 Essential (primary) hypertension: Secondary | ICD-10-CM

## 2021-10-27 NOTE — Telephone Encounter (Signed)
[facility] will not fill pts RX for amLODipine (NORVASC) 2.5 MG tablet until 5.26.23 / pt stated he is out of medication and would like a refill to get him to 5.26.23 [facility] to [facility]  ? ?Send to  ?[id number] - [place], [place] - [address] Phone:  [phone]  ?Fax:  [phone]  ?  ?/ please advise  ?

## 2021-10-28 MED ORDER — AMLODIPINE BESYLATE 2.5 MG PO TABS: 2.5000 mg | ORAL_TABLET | Freq: Every day | ORAL | 0 refills | Status: DC

## 2021-10-28 NOTE — Telephone Encounter (Signed)
Requested Prescriptions  ?Pending Prescriptions Disp Refills  ?? amLODipine (NORVASC) 2.5 MG tablet 90 tablet 1  ?  Sig: Take 1 tablet (2.5 mg total) by mouth daily.  ?  ? Cardiovascular: Calcium Channel Blockers 2 Passed - 10/27/2021  4:36 PM  ?  ?  Passed - Last BP in normal range  ?  BP Readings from Last 1 Encounters:  ?09/10/21 120/80  ?   ?  ?  Passed - Last Heart Rate in normal range  ?  Pulse Readings from Last 1 Encounters:  ?09/10/21 80  ?   ?  ?  Passed - Valid encounter within last 6 months  ?  Recent Outpatient Visits   ?      ? 1 month ago Essential hypertension  ? [facility] Juline Patch, MD  ? 5 months ago Essential hypertension  ? [facility] Juline Patch, MD  ? 9 months ago Pneumonia of left lower lobe due to infectious organism  ? [facility] Juline Patch, MD  ? 10 months ago Fever, unspecified fever cause  ? [facility] Juline Patch, MD  ? 1 year ago Essential hypertension  ? [facility] Juline Patch, MD  ?  ?  ?Future Appointments   ?        ? In 4 months Juline Patch, MD [facility] [place]  ?  ? ?  ?  ?  ? ?

## 2021-10-28 NOTE — Telephone Encounter (Signed)
Called pt and LMOM to return call.  ? ?Medication was refilled 09/10/2021 #90 1 refill. Pt should have gotten 90 day supply around refill date. Pt should have an ample supply.  ?

## 2022-01-18 ENCOUNTER — Other Ambulatory Visit: Payer: Self-pay | Admitting: Family Medicine

## 2022-02-11 ENCOUNTER — Other Ambulatory Visit: Payer: Self-pay | Admitting: Family Medicine

## 2022-02-11 DIAGNOSIS — E782 Mixed hyperlipidemia: Secondary | ICD-10-CM

## 2022-03-13 ENCOUNTER — Encounter: Payer: Self-pay | Admitting: Family Medicine

## 2022-03-13 ENCOUNTER — Ambulatory Visit: Payer: 59 | Admitting: Family Medicine

## 2022-03-13 ENCOUNTER — Ambulatory Visit (INDEPENDENT_AMBULATORY_CARE_PROVIDER_SITE_OTHER): Payer: 59 | Admitting: Family Medicine

## 2022-03-13 VITALS — BP 130/86 | HR 76 | Ht 72.0 in | Wt 272.0 lb

## 2022-03-13 DIAGNOSIS — F329 Major depressive disorder, single episode, unspecified: Secondary | ICD-10-CM | POA: Diagnosis not present

## 2022-03-13 DIAGNOSIS — I10 Essential (primary) hypertension: Secondary | ICD-10-CM | POA: Diagnosis not present

## 2022-03-13 DIAGNOSIS — E782 Mixed hyperlipidemia: Secondary | ICD-10-CM

## 2022-03-13 DIAGNOSIS — J301 Allergic rhinitis due to pollen: Secondary | ICD-10-CM

## 2022-03-13 MED ORDER — AMLODIPINE BESYLATE 2.5 MG PO TABS: 2.5000 mg | ORAL_TABLET | Freq: Every day | ORAL | 1 refills | Status: DC

## 2022-03-13 MED ORDER — LOSARTAN POTASSIUM 100 MG PO TABS: 100.0000 mg | ORAL_TABLET | Freq: Every day | ORAL | 1 refills | Status: DC

## 2022-03-13 MED ORDER — LORATADINE 10 MG PO TABS: 10.0000 mg | ORAL_TABLET | Freq: Every day | ORAL | 1 refills | Status: DC

## 2022-03-13 MED ORDER — FLUTICASONE PROPIONATE 50 MCG/ACT NA SUSP: NASAL | 1 refills | Status: DC

## 2022-03-13 MED ORDER — SERTRALINE HCL 50 MG PO TABS: 75.0000 mg | ORAL_TABLET | Freq: Every day | ORAL | 1 refills | Status: DC

## 2022-03-13 MED ORDER — EZETIMIBE 10 MG PO TABS: 10.0000 mg | ORAL_TABLET | Freq: Every day | ORAL | 1 refills | Status: DC

## 2022-03-13 NOTE — Progress Notes (Signed)
Date:  03/13/2022   Name:  Erik Valdez   DOB:  07-31-1965   MRN:  [id number]   Chief Complaint: Depression, Hypertension, Hyperlipidemia, and Allergic Rhinitis   Depression        This is a chronic problem.  The current episode started more than 1 year ago.   The onset quality is sudden.   The problem has been gradually improving since onset.  Associated symptoms include no decreased concentration, no fatigue, no helplessness, no hopelessness, does not have insomnia, not irritable, no restlessness, no decreased interest, no appetite change, no body aches, no myalgias, no headaches, no indigestion, not sad and no suicidal ideas.  Past treatments include SSRIs - Selective serotonin reuptake inhibitors.  Compliance with treatment is good.  Previous treatment provided mild relief.   Pertinent negatives include no hypothyroidism. Hypertension This is a chronic problem. The current episode started more than 1 year ago. The problem has been gradually improving since onset. The problem is controlled. Pertinent negatives include no chest pain, headaches, neck pain, palpitations or shortness of breath. Risk factors for coronary artery disease include dyslipidemia. Past treatments include calcium channel blockers and angiotensin blockers. The current treatment provides moderate improvement. There is no history of angina, kidney disease, CAD/MI, CVA, heart failure, left ventricular hypertrophy, PVD or retinopathy. There is no history of chronic renal disease, a hypertension causing med or renovascular disease.  Hyperlipidemia This is a chronic problem. The current episode started more than 1 year ago. The problem is controlled. Recent lipid tests were reviewed and are normal. He has no history of chronic renal disease, diabetes, hypothyroidism, liver disease, obesity or nephrotic syndrome. There are no known factors aggravating his hyperlipidemia. Pertinent negatives include no chest pain, myalgias or  shortness of breath. Current antihyperlipidemic treatment includes statins. The current treatment provides moderate improvement of lipids. There are no compliance problems.     Lab Results  Component Value Date   NA 141 09/10/2021   K 4.9 09/10/2021   CO2 22 09/10/2021   GLUCOSE 130 (H) 09/10/2021   BUN 12 09/10/2021   CREATININE 0.85 09/10/2021   CALCIUM 10.0 09/10/2021   EGFR 103 09/10/2021   GFRNONAA 92 03/06/2020   Lab Results  Component Value Date   CHOL 204 (H) 09/10/2021   HDL 41 09/10/2021   LDLCALC 137 (H) 09/10/2021   TRIG 146 09/10/2021   CHOLHDL 5.1 (H) 01/05/2019   No results found for: "TSH" No results found for: "HGBA1C" Lab Results  Component Value Date   WBC 7.7 01/09/2021   HGB 15.5 01/09/2021   HCT 45.1 01/09/2021   MCV 91.1 01/09/2021   PLT 641 (H) 01/09/2021   Lab Results  Component Value Date   ALT 28 09/10/2021   AST 19 09/10/2021   ALKPHOS 101 09/10/2021   BILITOT 0.4 09/10/2021   No results found for: "25OHVITD2", "25OHVITD3", "VD25OH"   Review of Systems  Constitutional:  Negative for appetite change, chills, fatigue and fever.  HENT:  Negative for drooling, ear discharge, ear pain and sore throat.   Respiratory:  Negative for cough, shortness of breath and wheezing.   Cardiovascular:  Negative for chest pain, palpitations and leg swelling.  Gastrointestinal:  Negative for abdominal pain, blood in stool, constipation, diarrhea and nausea.  Endocrine: Negative for polydipsia.  Genitourinary:  Negative for dysuria, frequency, hematuria and urgency.  Musculoskeletal:  Negative for back pain, myalgias and neck pain.  Skin:  Negative for rash.  Allergic/Immunologic: Negative for  environmental allergies.  Neurological:  Negative for dizziness and headaches.  Hematological:  Does not bruise/bleed easily.  Psychiatric/Behavioral:  Positive for depression. Negative for decreased concentration and suicidal ideas. The patient is not  nervous/anxious and does not have insomnia.     Patient Active Problem List   Diagnosis Date Noted   Reactive depression 07/13/2019   Anxiety 07/13/2019   Pure hypercholesterolemia 10/13/2017   Stable angina pectoris ([place]) 10/01/2016   External hemorrhoid 09/28/2016   Mixed hyperlipidemia 09/28/2016   Chronic seasonal allergic rhinitis due to pollen 09/28/2016   Blood in stool    Laboratory animal allergy 10/26/2014   Acute back pain with sciatica 10/26/2014   Routine general medical examination at a health care facility 10/26/2014   Essential hypertension 10/26/2014   Adult BMI 30+ 10/26/2014    No Known Allergies  Past Surgical History:  Procedure Laterality Date   COLONOSCOPY WITH PROPOFOL N/A 01/16/2016   Procedure: COLONOSCOPY WITH PROPOFOL;  Surgeon: Lucilla Lame, MD;  Location: [facility];  Service: Endoscopy;  Laterality: N/A;   NASAL SINUS SURGERY     age 56 or 56    Social History   Tobacco Use   Smoking status: Never   Smokeless tobacco: Former    Types: Chew    Quit date: 06/10/2018   Tobacco comments:    Smokeless tobacco - occasionally (golfing, etc)  Vaping Use   Vaping Use: Never used  Substance Use Topics   Alcohol use: Yes    Alcohol/week: 6.0 standard drinks of alcohol    Types: 6 Cans of beer per week   Drug use: Never     Medication list has been reviewed and updated.  Current Meds  Medication Sig   amLODipine (NORVASC) 2.5 MG tablet Take 1 tablet (2.5 mg total) by mouth daily.   ezetimibe (ZETIA) 10 MG tablet TAKE 1 TABLET BY MOUTH DAILY   fluticasone (FLONASE) 50 MCG/ACT nasal spray USE 1 SPRAY IN BOTH  NOSTRILS DAILY   loratadine (CLARITIN) 10 MG tablet Take 1 tablet (10 mg total) by mouth daily.   losartan (COZAAR) 100 MG tablet Take 1 tablet (100 mg total) by mouth daily.   PROCTOSOL HC 2.5 % rectal cream PLACE 1 APPLICATION  RECTALLY TWICE DAILY   sertraline (ZOLOFT) 50 MG tablet Take 1.5 tablets (75 mg total) by mouth daily.        03/13/2022    8:29 AM 09/10/2021    9:27 AM 05/13/2021    8:51 AM 08/26/2020   10:35 AM  GAD 7 : Generalized Anxiety Score  Nervous, Anxious, on Edge 0 0 1 0  Control/stop worrying 0 0 1 0  Worry too much - different things 0 0 1 0  Trouble relaxing 0 0 1 0  Restless 0 0 3 0  Easily annoyed or irritable 0 0 2 0  Afraid - awful might happen 0 0 0 0  Total GAD 7 Score 0 0 9 0  Anxiety Difficulty Not difficult at all Not difficult at all Somewhat difficult        03/13/2022    8:28 AM 09/10/2021    9:27 AM 05/13/2021    8:51 AM  Depression screen PHQ 2/9  Decreased Interest 0 0 0  Down, Depressed, Hopeless 0 0 0  PHQ - 2 Score 0 0 0  Altered sleeping 0 0 0  Tired, decreased energy 0 0 0  Change in appetite 0 0 1  Feeling bad or failure about yourself  0 0 0  Trouble concentrating 0 0 1  Moving slowly or fidgety/restless 0 0 0  Suicidal thoughts 0 0 0  PHQ-9 Score 0 0 2  Difficult doing work/chores Not difficult at all Not difficult at all Not difficult at all    BP Readings from Last 3 Encounters:  03/13/22 130/86  09/10/21 120/80  05/13/21 118/84    Physical Exam Vitals and nursing note reviewed.  Constitutional:      General: He is not irritable. HENT:     Head: Normocephalic.     Right Ear: Tympanic membrane and external ear normal.     Left Ear: Tympanic membrane and external ear normal.     Nose: Nose normal.  Eyes:     General: No scleral icterus.       Right eye: No discharge.        Left eye: No discharge.     Conjunctiva/sclera: Conjunctivae normal.     Pupils: Pupils are equal, round, and reactive to light.  Neck:     Thyroid: No thyromegaly.     Vascular: No JVD.     Trachea: No tracheal deviation.  Cardiovascular:     Rate and Rhythm: Normal rate and regular rhythm.     Heart sounds: Normal heart sounds. No murmur heard.    No friction rub. No gallop.  Pulmonary:     Effort: No respiratory distress.     Breath sounds: Normal breath  sounds. No wheezing, rhonchi or rales.  Abdominal:     General: Bowel sounds are normal.     Palpations: Abdomen is soft. There is no mass.     Tenderness: There is no abdominal tenderness. There is no guarding or rebound.  Musculoskeletal:        General: No tenderness. Normal range of motion.     Cervical back: Normal range of motion and neck supple.  Lymphadenopathy:     Cervical: No cervical adenopathy.  Skin:    General: Skin is warm.     Findings: No bruising, erythema or rash.  Neurological:     Mental Status: He is alert and oriented to person, place, and time.     Cranial Nerves: No cranial nerve deficit.     Deep Tendon Reflexes: Reflexes are normal and symmetric.     Wt Readings from Last 3 Encounters:  03/13/22 272 lb (123.4 kg)  09/10/21 276 lb (125.2 kg)  05/13/21 272 lb (123.4 kg)    BP 130/86   Pulse 76   Ht 6' (1.829 m)   Wt 272 lb (123.4 kg)   BMI 36.89 kg/m   Assessment and Plan:  1. Essential hypertension Chronic.  Controlled.  Stable.  Blood pressure 130/86.  Continue amlodipine 2.5 mg once a day and losartan 100 mg once a day.  We will check renal function panel.  We will recheck patient in 6 months. - amLODipine (NORVASC) 2.5 MG tablet; Take 1 tablet (2.5 mg total) by mouth daily.  Dispense: 90 tablet; Refill: 1 - losartan (COZAAR) 100 MG tablet; Take 1 tablet (100 mg total) by mouth daily.  Dispense: 90 tablet; Refill: 1 - Renal Function Panel  2. Mixed hyperlipidemia Chronic.  Controlled.  Stable.  Continue Zetia 10 mg once a day.  We will check lipid panel. - ezetimibe (ZETIA) 10 MG tablet; Take 1 tablet (10 mg total) by mouth daily.  Dispense: 90 tablet; Refill: 1 - Lipid Panel With LDL/HDL Ratio  3. Chronic seasonal allergic rhinitis due  to pollen Chronic.  Controlled.  Stable.  Continue Flonase and Claritin for seasonal allergies as needed symptomatology. - fluticasone (FLONASE) 50 MCG/ACT nasal spray; USE 1 SPRAY IN BOTH  NOSTRILS DAILY   Dispense: 32 g; Refill: 1 - loratadine (CLARITIN) 10 MG tablet; Take 1 tablet (10 mg total) by mouth daily.  Dispense: 90 tablet; Refill: 1  4. Reactive depression Chronic.  Controlled.  Stable.  PHQ 0 gad score 0 continue sertraline 50 mg 1-1/2 mg for total 75 mg daily. - sertraline (ZOLOFT) 50 MG tablet; Take 1.5 tablets (75 mg total) by mouth daily.  Dispense: 135 tablet; Refill: 1    Otilio Miu, MD

## 2022-03-14 LAB — RENAL FUNCTION PANEL
Albumin: 4.3 g/dL (ref 3.8–4.9)
BUN/Creatinine Ratio: 14 (ref 9–20)
BUN: 12 mg/dL (ref 6–24)
CO2: 21 mmol/L (ref 20–29)
Calcium: 9.6 mg/dL (ref 8.7–10.2)
Chloride: 103 mmol/L (ref 96–106)
Creatinine, Ser: 0.86 mg/dL (ref 0.76–1.27)
Glucose: 144 mg/dL — ABNORMAL HIGH (ref 70–99)
Phosphorus: 2.7 mg/dL — ABNORMAL LOW (ref 2.8–4.1)
Potassium: 4.7 mmol/L (ref 3.5–5.2)
Sodium: 141 mmol/L (ref 134–144)
eGFR: 103 mL/min/{1.73_m2} (ref >59)

## 2022-03-14 LAB — LIPID PANEL WITH LDL/HDL RATIO
Cholesterol, Total: 215 mg/dL — ABNORMAL HIGH (ref 100–199)
HDL: 39 mg/dL — ABNORMAL LOW (ref >39)
LDL Chol Calc (NIH): 152 mg/dL — ABNORMAL HIGH (ref 0–99)
LDL/HDL Ratio: 3.9 ratio — ABNORMAL HIGH (ref 0.0–3.6)
Triglycerides: 131 mg/dL (ref 0–149)
VLDL Cholesterol Cal: 24 mg/dL (ref 5–40)

## 2022-03-16 ENCOUNTER — Other Ambulatory Visit: Payer: Self-pay

## 2022-03-16 DIAGNOSIS — E782 Mixed hyperlipidemia: Secondary | ICD-10-CM

## 2022-03-16 DIAGNOSIS — R739 Hyperglycemia, unspecified: Secondary | ICD-10-CM

## 2022-03-16 MED ORDER — ROSUVASTATIN CALCIUM 5 MG PO TABS: 5.0000 mg | ORAL_TABLET | Freq: Every day | ORAL | 1 refills | Status: DC

## 2022-03-18 LAB — HEMOGLOBIN A1C
Est. average glucose Bld gHb Est-mCnc: 140 mg/dL
Hgb A1c MFr Bld: 6.5 % — ABNORMAL HIGH (ref 4.8–5.6)

## 2022-05-11 ENCOUNTER — Other Ambulatory Visit: Payer: Self-pay

## 2022-05-11 ENCOUNTER — Telehealth: Payer: Self-pay | Admitting: Family Medicine

## 2022-05-11 DIAGNOSIS — E782 Mixed hyperlipidemia: Secondary | ICD-10-CM

## 2022-05-11 DIAGNOSIS — E119 Type 2 diabetes mellitus without complications: Secondary | ICD-10-CM

## 2022-05-11 DIAGNOSIS — R69 Illness, unspecified: Secondary | ICD-10-CM

## 2022-05-11 NOTE — Progress Notes (Signed)
Ordered labs

## 2022-05-11 NOTE — Telephone Encounter (Signed)
Copied from CRM [id number]. Topic: General - Other >> May 11, 2022 10:33 AM Macon Large wrote: Reason for CRM: Pt requests that labs be ordered so he can schedule an appt to come in for those labs.

## 2022-06-12 ENCOUNTER — Other Ambulatory Visit: Payer: Self-pay

## 2022-06-12 DIAGNOSIS — E782 Mixed hyperlipidemia: Secondary | ICD-10-CM

## 2022-06-12 MED ORDER — ROSUVASTATIN CALCIUM 5 MG PO TABS: 5.0000 mg | ORAL_TABLET | Freq: Every day | ORAL | 1 refills | Status: DC

## 2022-08-03 ENCOUNTER — Encounter: Payer: Self-pay | Admitting: Family Medicine

## 2022-08-03 ENCOUNTER — Ambulatory Visit (INDEPENDENT_AMBULATORY_CARE_PROVIDER_SITE_OTHER): Payer: 59 | Admitting: Family Medicine

## 2022-08-03 VITALS — BP 120/82 | HR 92 | Ht 72.0 in | Wt 270.0 lb

## 2022-08-03 DIAGNOSIS — J0100 Acute maxillary sinusitis, unspecified: Secondary | ICD-10-CM | POA: Diagnosis not present

## 2022-08-03 DIAGNOSIS — R051 Acute cough: Secondary | ICD-10-CM

## 2022-08-03 MED ORDER — AMOXICILLIN 500 MG PO CAPS: 500.0000 mg | ORAL_CAPSULE | Freq: Three times a day (TID) | ORAL | 0 refills | Status: AC

## 2022-08-03 MED ORDER — PROMETHAZINE-DM 6.25-15 MG/5ML PO SYRP: 5.0000 mL | ORAL_SOLUTION | Freq: Four times a day (QID) | ORAL | 0 refills | Status: DC | PRN

## 2022-08-03 NOTE — Progress Notes (Signed)
Date:  08/03/2022   Name:  Erik Valdez   DOB:  1965/12/09   MRN:  [id number]   Chief Complaint: Sinusitis (Cough and cong- sinus pressure, fever off and on)  Sinusitis This is a new problem. The current episode started in the past 7 days. The problem has been waxing and waning since onset. The maximum temperature recorded prior to his arrival was 100.4 - 100.9 F. The pain is mild. Associated symptoms include congestion, sinus pressure, sneezing and a sore throat. Pertinent negatives include no chills, coughing, ear pain, headaches or shortness of breath. Past treatments include acetaminophen and nasal decongestants. The treatment provided no relief.    Lab Results  Component Value Date   NA 141 03/13/2022   K 4.7 03/13/2022   CO2 21 03/13/2022   GLUCOSE 144 (H) 03/13/2022   BUN 12 03/13/2022   CREATININE 0.86 03/13/2022   CALCIUM 9.6 03/13/2022   EGFR 103 03/13/2022   GFRNONAA 92 03/06/2020   Lab Results  Component Value Date   CHOL 215 (H) 03/13/2022   HDL 39 (L) 03/13/2022   LDLCALC 152 (H) 03/13/2022   TRIG 131 03/13/2022   CHOLHDL 5.1 (H) 01/05/2019   No results found for: "TSH" Lab Results  Component Value Date   HGBA1C 6.5 (H) 03/17/2022   Lab Results  Component Value Date   WBC 7.7 01/09/2021   HGB 15.5 01/09/2021   HCT 45.1 01/09/2021   MCV 91.1 01/09/2021   PLT 641 (H) 01/09/2021   Lab Results  Component Value Date   ALT 28 09/10/2021   AST 19 09/10/2021   ALKPHOS 101 09/10/2021   BILITOT 0.4 09/10/2021   No results found for: "25OHVITD2", "25OHVITD3", "VD25OH"   Review of Systems  Constitutional:  Negative for chills.  HENT:  Positive for congestion, postnasal drip, rhinorrhea, sinus pressure, sinus pain, sneezing and sore throat. Negative for ear pain.   Respiratory:  Positive for chest tightness. Negative for cough, shortness of breath and wheezing.   Cardiovascular:  Negative for chest pain and palpitations.  Neurological:  Negative for  headaches.    Patient Active Problem List   Diagnosis Date Noted   Reactive depression 07/13/2019   Anxiety 07/13/2019   Pure hypercholesterolemia 10/13/2017   Stable angina pectoris 10/01/2016   External hemorrhoid 09/28/2016   Mixed hyperlipidemia 09/28/2016   Chronic seasonal allergic rhinitis due to pollen 09/28/2016   Blood in stool    Laboratory animal allergy 10/26/2014   Acute back pain with sciatica 10/26/2014   Routine general medical examination at a health care facility 10/26/2014   Essential hypertension 10/26/2014   Adult BMI 30+ 10/26/2014    No Known Allergies  Past Surgical History:  Procedure Laterality Date   COLONOSCOPY WITH PROPOFOL N/A 01/16/2016   Procedure: COLONOSCOPY WITH PROPOFOL;  Surgeon: Lucilla Lame, MD;  Location: [facility];  Service: Endoscopy;  Laterality: N/A;   NASAL SINUS SURGERY     age 73 or 8    Social History   Tobacco Use   Smoking status: Never   Smokeless tobacco: Former    Types: Chew    Quit date: 06/10/2018   Tobacco comments:    Smokeless tobacco - occasionally (golfing, etc)  Vaping Use   Vaping Use: Never used  Substance Use Topics   Alcohol use: Yes    Alcohol/week: 6.0 standard drinks of alcohol    Types: 6 Cans of beer per week   Drug use: Never  Medication list has been reviewed and updated.  No outpatient medications have been marked as taking for the 08/03/22 encounter (Office Visit) with Juline Patch, MD.       08/03/2022   11:16 AM 03/13/2022    8:29 AM 09/10/2021    9:27 AM 05/13/2021    8:51 AM  GAD 7 : Generalized Anxiety Score  Nervous, Anxious, on Edge 0 0 0 1  Control/stop worrying 0 0 0 1  Worry too much - different things 0 0 0 1  Trouble relaxing 0 0 0 1  Restless 0 0 0 3  Easily annoyed or irritable 0 0 0 2  Afraid - awful might happen 0 0 0 0  Total GAD 7 Score 0 0 0 9  Anxiety Difficulty Not difficult at all Not difficult at all Not difficult at all Somewhat difficult        08/03/2022   11:16 AM 03/13/2022    8:28 AM 09/10/2021    9:27 AM  Depression screen PHQ 2/9  Decreased Interest 0 0 0  Down, Depressed, Hopeless 0 0 0  PHQ - 2 Score 0 0 0  Altered sleeping 0 0 0  Tired, decreased energy 0 0 0  Change in appetite 0 0 0  Feeling bad or failure about yourself  0 0 0  Trouble concentrating 0 0 0  Moving slowly or fidgety/restless 0 0 0  Suicidal thoughts 0 0 0  PHQ-9 Score 0 0 0  Difficult doing work/chores Not difficult at all Not difficult at all Not difficult at all    BP Readings from Last 3 Encounters:  08/03/22 120/82  03/13/22 130/86  09/10/21 120/80    Physical Exam Vitals and nursing note reviewed.  HENT:     Head: Normocephalic.     Right Ear: Tympanic membrane and external ear normal.     Left Ear: Tympanic membrane and external ear normal.     Nose: Nose normal.     Mouth/Throat:     Mouth: Mucous membranes are moist.  Eyes:     General: No scleral icterus.       Right eye: No discharge.        Left eye: No discharge.     Conjunctiva/sclera: Conjunctivae normal.     Pupils: Pupils are equal, round, and reactive to light.  Neck:     Thyroid: No thyromegaly.     Vascular: No JVD.     Trachea: No tracheal deviation.  Cardiovascular:     Rate and Rhythm: Normal rate and regular rhythm.     Heart sounds: Normal heart sounds. No murmur heard.    No friction rub. No gallop.  Pulmonary:     Effort: No respiratory distress.     Breath sounds: Normal breath sounds. No wheezing, rhonchi or rales.  Chest:     Chest wall: No tenderness.  Abdominal:     General: Bowel sounds are normal.     Palpations: Abdomen is soft. There is no mass.     Tenderness: There is no abdominal tenderness. There is no guarding or rebound.  Musculoskeletal:        General: No tenderness. Normal range of motion.     Cervical back: Normal range of motion and neck supple.  Lymphadenopathy:     Cervical: No cervical adenopathy.  Skin:     General: Skin is warm.     Findings: No erythema or rash.  Neurological:     Mental Status:  He is alert.     Wt Readings from Last 3 Encounters:  08/03/22 270 lb (122.5 kg)  03/13/22 272 lb (123.4 kg)  09/10/21 276 lb (125.2 kg)    BP 120/82   Pulse 92   Ht 6' (1.829 m)   Wt 270 lb (122.5 kg)   SpO2 93%   BMI 36.62 kg/m   Assessment and Plan:  1. Acute maxillary sinusitis, recurrence not specified  New onset. New onset.  Persistent.  Stable.  Will initiate amoxicillin 500 mg 3 times a day for 10 days.- amoxicillin (AMOXIL) 500 MG capsule; Take 1 capsule (500 mg total) by mouth 3 (three) times daily for 10 days.  Dispense: 30 capsule; Refill: 0  2. Acute cough New onset.  Persistent.  Unable to sleep at night.  We will initiate promethazine with dextromethorphan nightly and encourage Mucinex DM during the day. - promethazine-dextromethorphan (PROMETHAZINE-DM) 6.25-15 MG/5ML syrup; Take 5 mLs by mouth 4 (four) times daily as needed for cough.  Dispense: 118 mL; Refill: 0   Otilio Miu, MD

## 2022-08-27 ENCOUNTER — Other Ambulatory Visit: Payer: Self-pay | Admitting: Family Medicine

## 2022-08-27 DIAGNOSIS — I10 Essential (primary) hypertension: Secondary | ICD-10-CM

## 2022-08-27 DIAGNOSIS — F329 Major depressive disorder, single episode, unspecified: Secondary | ICD-10-CM

## 2022-08-28 NOTE — Telephone Encounter (Signed)
Requested Prescriptions  Pending Prescriptions Disp Refills   sertraline (ZOLOFT) 50 MG tablet [Pharmacy Med Name: Sertraline HCl 50 MG Oral Tablet] 135 tablet 0    Sig: TAKE 1 AND 1/2 TABLETS BY MOUTH  DAILY     Psychiatry:  Antidepressants - SSRI - sertraline Passed - 08/27/2022 10:58 PM      Passed - AST in normal range and within 360 days    AST  Date Value Ref Range Status  09/10/2021 19 0 - 40 IU/L Final         Passed - ALT in normal range and within 360 days    ALT  Date Value Ref Range Status  09/10/2021 28 0 - 44 IU/L Final         Passed - Completed PHQ-2 or PHQ-9 in the last 360 days      Passed - Valid encounter within last 6 months    Recent Outpatient Visits           3 weeks ago Acute maxillary sinusitis, recurrence not specified   [facility] [place], Deanna C, MD   5 months ago Essential hypertension   [place] [facility] [place], Deanna C, MD   11 months ago Essential hypertension   [place] [facility] [place], Deanna C, MD   1 year ago Essential hypertension   [place] [facility] [place], Deanna C, MD   1 year ago Pneumonia of left lower lobe due to infectious organism   [place] [facility] [place], Deanna C, MD       Future Appointments             In 2 weeks Juline Patch, MD [place] [facility]             losartan (COZAAR) 100 MG tablet [Pharmacy Med Name: Losartan Potassium 100 MG Oral Tablet] 90 tablet 0    Sig: TAKE 1 TABLET BY MOUTH DAILY     Cardiovascular:  Angiotensin Receptor Blockers Passed - 08/27/2022 10:58 PM      Passed - Cr in normal range and within 180 days    Creatinine, Ser  Date Value Ref Range Status  03/13/2022 0.86 0.76 - 1.27 mg/dL Final         Passed - K  in normal range and within 180 days    Potassium  Date Value Ref Range Status  03/13/2022 4.7 3.5 - 5.2 mmol/L Final         Passed - Patient is not pregnant      Passed - Last BP in normal range    BP Readings from Last 1 Encounters:  08/03/22 120/82         Passed - Valid encounter within last 6 months    Recent Outpatient Visits           3 weeks ago Acute maxillary sinusitis, recurrence not specified   [facility] [place], Deanna C, MD   5 months ago Essential hypertension   [place] [facility] [place], Deanna C, MD   11 months ago Essential hypertension   [place] [facility], [facility]  1 year ago Essential hypertension   [facility] [place], Deanna C, MD   1 year ago Pneumonia of left lower lobe due to infectious organism   [place] [facility] [place], [place], MD       Future Appointments             In 2 weeks Juline Patch, MD [place] at [facility], [facility]

## 2022-09-11 ENCOUNTER — Ambulatory Visit: Payer: 59 | Admitting: Family Medicine

## 2022-09-22 ENCOUNTER — Encounter: Payer: Self-pay | Admitting: Family Medicine

## 2022-09-22 ENCOUNTER — Ambulatory Visit (INDEPENDENT_AMBULATORY_CARE_PROVIDER_SITE_OTHER): Payer: 59 | Admitting: Family Medicine

## 2022-09-22 VITALS — BP 120/78 | HR 80 | Ht 72.0 in | Wt 274.0 lb

## 2022-09-22 DIAGNOSIS — J301 Allergic rhinitis due to pollen: Secondary | ICD-10-CM

## 2022-09-22 DIAGNOSIS — F329 Major depressive disorder, single episode, unspecified: Secondary | ICD-10-CM | POA: Diagnosis not present

## 2022-09-22 DIAGNOSIS — E782 Mixed hyperlipidemia: Secondary | ICD-10-CM | POA: Diagnosis not present

## 2022-09-22 DIAGNOSIS — I10 Essential (primary) hypertension: Secondary | ICD-10-CM

## 2022-09-22 DIAGNOSIS — E119 Type 2 diabetes mellitus without complications: Secondary | ICD-10-CM

## 2022-09-22 MED ORDER — ROSUVASTATIN CALCIUM 5 MG PO TABS: 5.0000 mg | ORAL_TABLET | Freq: Every day | ORAL | 1 refills | Status: DC

## 2022-09-22 MED ORDER — LORATADINE 10 MG PO TABS: 10.0000 mg | ORAL_TABLET | Freq: Every day | ORAL | 1 refills | Status: AC

## 2022-09-22 MED ORDER — AMLODIPINE BESYLATE 2.5 MG PO TABS: 2.5000 mg | ORAL_TABLET | Freq: Every day | ORAL | 1 refills | Status: DC

## 2022-09-22 MED ORDER — LOSARTAN POTASSIUM 100 MG PO TABS: 100.0000 mg | ORAL_TABLET | Freq: Every day | ORAL | 0 refills | Status: DC

## 2022-09-22 MED ORDER — SERTRALINE HCL 50 MG PO TABS: 50.0000 mg | ORAL_TABLET | Freq: Every day | ORAL | 1 refills | Status: DC

## 2022-09-22 MED ORDER — FLUTICASONE PROPIONATE 50 MCG/ACT NA SUSP: NASAL | 1 refills | Status: DC

## 2022-09-22 NOTE — Progress Notes (Signed)
Date:  09/22/2022   Name:  Erik Valdez   DOB:  February 13, 1966   MRN:  [id number]   Chief Complaint: Hypertension, Allergic Rhinitis , Hyperlipidemia, and Depression  Hypertension This is a chronic problem. The current episode started more than 1 year ago. The problem has been gradually improving since onset. The problem is controlled. Pertinent negatives include no anxiety, blurred vision, chest pain, headaches, orthopnea, palpitations, PND or shortness of breath. There are no associated agents to hypertension. Past treatments include angiotensin blockers and calcium channel blockers. The current treatment provides moderate improvement. There are no compliance problems.  There is no history of angina, kidney disease, CAD/MI or CVA. There is no history of chronic renal disease, a hypertension causing med or renovascular disease.  Hyperlipidemia This is a chronic problem. The current episode started more than 1 year ago. The problem is controlled. Recent lipid tests were reviewed and are normal. He has no history of chronic renal disease. Pertinent negatives include no chest pain, myalgias or shortness of breath. Current antihyperlipidemic treatment includes statins and diet change. The current treatment provides moderate improvement of lipids. There are no compliance problems.   Depression        This is a chronic problem.  The current episode started more than 1 year ago.   The problem has been gradually improving since onset.  Associated symptoms include no decreased concentration, no fatigue, no helplessness, no hopelessness, does not have insomnia, not irritable, no restlessness, no decreased interest, no appetite change, no body aches, no myalgias, no headaches, no indigestion, not sad and no suicidal ideas.  Past treatments include SSRIs - Selective serotonin reuptake inhibitors.  Compliance with treatment is good.  Previous treatment provided mild relief.   Pertinent negatives include no  anxiety. Diabetes He presents for his follow-up diabetic visit. He has type 2 diabetes mellitus. His disease course has been stable. Pertinent negatives for hypoglycemia include no headaches. Pertinent negatives for diabetes include no blurred vision, no chest pain, no fatigue, no polydipsia and no polyuria. There are no hypoglycemic complications. Symptoms are stable. There are no diabetic complications. Pertinent negatives for diabetic complications include no CVA. Current diabetic treatment includes diet.    Lab Results  Component Value Date   NA 141 03/13/2022   K 4.7 03/13/2022   CO2 21 03/13/2022   GLUCOSE 144 (H) 03/13/2022   BUN 12 03/13/2022   CREATININE 0.86 03/13/2022   CALCIUM 9.6 03/13/2022   EGFR 103 03/13/2022   GFRNONAA 92 03/06/2020   Lab Results  Component Value Date   CHOL 215 (H) 03/13/2022   HDL 39 (L) 03/13/2022   LDLCALC 152 (H) 03/13/2022   TRIG 131 03/13/2022   CHOLHDL 5.1 (H) 01/05/2019   No results found for: "TSH" Lab Results  Component Value Date   HGBA1C 6.5 (H) 03/17/2022   Lab Results  Component Value Date   WBC 7.7 01/09/2021   HGB 15.5 01/09/2021   HCT 45.1 01/09/2021   MCV 91.1 01/09/2021   PLT 641 (H) 01/09/2021   Lab Results  Component Value Date   ALT 28 09/10/2021   AST 19 09/10/2021   ALKPHOS 101 09/10/2021   BILITOT 0.4 09/10/2021   No results found for: "25OHVITD2", "25OHVITD3", "VD25OH"   Review of Systems  Constitutional:  Negative for appetite change, fatigue and unexpected weight change.  HENT:  Negative for congestion, sinus pressure, tinnitus and voice change.   Eyes:  Negative for blurred vision.  Respiratory:  Negative for chest tightness, shortness of breath and wheezing.   Cardiovascular:  Negative for chest pain, palpitations, orthopnea, leg swelling and PND.  Gastrointestinal:  Negative for abdominal distention.  Endocrine: Negative for polydipsia and polyuria.  Musculoskeletal:  Negative for myalgias.   Neurological:  Negative for headaches.  Psychiatric/Behavioral:  Positive for depression. Negative for decreased concentration and suicidal ideas. The patient does not have insomnia.     Patient Active Problem List   Diagnosis Date Noted   Reactive depression 07/13/2019   Anxiety 07/13/2019   Pure hypercholesterolemia 10/13/2017   Stable angina pectoris 10/01/2016   External hemorrhoid 09/28/2016   Mixed hyperlipidemia 09/28/2016   Chronic seasonal allergic rhinitis due to pollen 09/28/2016   Blood in stool    Laboratory animal allergy 10/26/2014   Acute back pain with sciatica 10/26/2014   Routine general medical examination at a health care facility 10/26/2014   Essential hypertension 10/26/2014   Adult BMI 30+ 10/26/2014    No Known Allergies  Past Surgical History:  Procedure Laterality Date   COLONOSCOPY WITH PROPOFOL N/A 01/16/2016   Procedure: COLONOSCOPY WITH PROPOFOL;  Surgeon: Lucilla Lame, MD;  Location: [facility];  Service: Endoscopy;  Laterality: N/A;   NASAL SINUS SURGERY     age 57 or 8    Social History   Tobacco Use   Smoking status: Never   Smokeless tobacco: Former    Types: Chew    Quit date: 06/10/2018   Tobacco comments:    Smokeless tobacco - occasionally (golfing, etc)  Vaping Use   Vaping Use: Never used  Substance Use Topics   Alcohol use: Yes    Alcohol/week: 6.0 standard drinks of alcohol    Types: 6 Cans of beer per week   Drug use: Never     Medication list has been reviewed and updated.  Current Meds  Medication Sig   amLODipine (NORVASC) 2.5 MG tablet Take 1 tablet (2.5 mg total) by mouth daily.   fluticasone (FLONASE) 50 MCG/ACT nasal spray USE 1 SPRAY IN BOTH  NOSTRILS DAILY   loratadine (CLARITIN) 10 MG tablet Take 1 tablet (10 mg total) by mouth daily.   losartan (COZAAR) 100 MG tablet TAKE 1 TABLET BY MOUTH DAILY   PROCTOSOL HC 2.5 % rectal cream PLACE 1 APPLICATION  RECTALLY TWICE DAILY   rosuvastatin (CRESTOR)  5 MG tablet Take 1 tablet (5 mg total) by mouth daily.   sertraline (ZOLOFT) 50 MG tablet TAKE 1 AND 1/2 TABLETS BY MOUTH  DAILY (Patient taking differently: Take by mouth daily.)   [DISCONTINUED] promethazine-dextromethorphan (PROMETHAZINE-DM) 6.25-15 MG/5ML syrup Take 5 mLs by mouth 4 (four) times daily as needed for cough.       09/22/2022    8:22 AM 08/03/2022   11:16 AM 03/13/2022    8:29 AM 09/10/2021    9:27 AM  GAD 7 : Generalized Anxiety Score  Nervous, Anxious, on Edge 0 0 0 0  Control/stop worrying 0 0 0 0  Worry too much - different things 0 0 0 0  Trouble relaxing 0 0 0 0  Restless 0 0 0 0  Easily annoyed or irritable 0 0 0 0  Afraid - awful might happen 0 0 0 0  Total GAD 7 Score 0 0 0 0  Anxiety Difficulty Not difficult at all Not difficult at all Not difficult at all Not difficult at all       09/22/2022    8:22 AM 08/03/2022   11:16 AM  03/13/2022    8:28 AM  Depression screen PHQ 2/9  Decreased Interest 0 0 0  Down, Depressed, Hopeless 0 0 0  PHQ - 2 Score 0 0 0  Altered sleeping 0 0 0  Tired, decreased energy 0 0 0  Change in appetite 0 0 0  Feeling bad or failure about yourself  0 0 0  Trouble concentrating 0 0 0  Moving slowly or fidgety/restless 0 0 0  Suicidal thoughts 0 0 0  PHQ-9 Score 0 0 0  Difficult doing work/chores Not difficult at all Not difficult at all Not difficult at all    BP Readings from Last 3 Encounters:  09/22/22 120/78  08/03/22 120/82  03/13/22 130/86    Physical Exam Vitals and nursing note reviewed.  Constitutional:      General: He is not irritable. HENT:     Right Ear: Tympanic membrane and ear canal normal.     Left Ear: Tympanic membrane and ear canal normal.     Nose: Nose normal. No congestion or rhinorrhea.     Mouth/Throat:     Mouth: Mucous membranes are moist.     Pharynx: Oropharynx is clear. No oropharyngeal exudate or posterior oropharyngeal erythema.  Eyes:     Pupils: Pupils are equal, round, and reactive  to light.  Cardiovascular:     Rate and Rhythm: Normal rate and regular rhythm.     Heart sounds: No murmur heard.    No gallop.  Pulmonary:     Effort: Pulmonary effort is normal.     Breath sounds: No wheezing, rhonchi or rales.  Abdominal:     Palpations: There is no hepatomegaly or splenomegaly.     Tenderness: There is no abdominal tenderness. There is no guarding.  Skin:    General: Skin is warm.  Neurological:     General: No focal deficit present.     Mental Status: He is alert.     Wt Readings from Last 3 Encounters:  09/22/22 274 lb (124.3 kg)  08/03/22 270 lb (122.5 kg)  03/13/22 272 lb (123.4 kg)    BP 120/78   Pulse 80   Ht 6' (1.829 m)   Wt 274 lb (124.3 kg)   SpO2 98%   BMI 37.16 kg/m   Assessment and Plan:  1. Essential hypertension Chronic.  Controlled.  Stable.  Blood pressure today 120/78.  Asymptomatic.  Tolerating medications well.  Will maintain on current dosings of amlodipine 2.5 mg once a day and losartan 100 mg once a day.  Will check CMP for electrolytes and GFR.  Will recheck patient in 6 months - amLODipine (NORVASC) 2.5 MG tablet; Take 1 tablet (2.5 mg total) by mouth daily.  Dispense: 90 tablet; Refill: 1 - losartan (COZAAR) 100 MG tablet; Take 1 tablet (100 mg total) by mouth daily.  Dispense: 90 tablet; Refill: 0 - Comprehensive Metabolic Panel (CMET)  2. Chronic seasonal allergic rhinitis due to pollen Chronic.  Controlled.  Stable.  Currently pollen is at max and we will continue with Flonase on a daily basis as well as loratadine 10 mg once a day. - fluticasone (FLONASE) 50 MCG/ACT nasal spray; USE 1 SPRAY IN BOTH  NOSTRILS DAILY  Dispense: 32 g; Refill: 1 - loratadine (CLARITIN) 10 MG tablet; Take 1 tablet (10 mg total) by mouth daily.  Dispense: 90 tablet; Refill: 1  3. Mixed hyperlipidemia .  Controlled.  Stable.  Pending lipid panel will likely continue Crestor 5 mg once a  day.  Will reemphasize low-cholesterol low triglyceride  dietary guidelines. - rosuvastatin (CRESTOR) 5 MG tablet; Take 1 tablet (5 mg total) by mouth daily.  Dispense: 90 tablet; Refill: 1 - HgB A1c - Lipid Panel With LDL/HDL Ratio - Comprehensive Metabolic Panel (CMET)  4. Reactive depression .  Controlled.  Stable.  PHQ 0 GAD score 0.  Continue sertraline 50 mg once a day.  Will recheck in 6 months. - sertraline (ZOLOFT) 50 MG tablet; Take 1 tablet (50 mg total) by mouth daily.  Dispense: 90 tablet; Refill: 1  5. Type 2 diabetes mellitus without complication, without long-term current use of insulin .  Controlled.  Stable.  Continue dietary control and will monitor A1c and proceed as indicated by measurement. - HgB A1c    Otilio Miu, MD

## 2022-09-23 LAB — COMPREHENSIVE METABOLIC PANEL
ALT: 24 IU/L (ref 0–44)
AST: 18 IU/L (ref 0–40)
Albumin/Globulin Ratio: 1.7 (ref 1.2–2.2)
Albumin: 4.4 g/dL (ref 3.8–4.9)
Alkaline Phosphatase: 110 IU/L (ref 44–121)
BUN/Creatinine Ratio: 16 (ref 9–20)
BUN: 15 mg/dL (ref 6–24)
Bilirubin Total: 0.5 mg/dL (ref 0.0–1.2)
CO2: 21 mmol/L (ref 20–29)
Calcium: 9.6 mg/dL (ref 8.7–10.2)
Chloride: 101 mmol/L (ref 96–106)
Creatinine, Ser: 0.91 mg/dL (ref 0.76–1.27)
Globulin, Total: 2.6 g/dL (ref 1.5–4.5)
Glucose: 140 mg/dL — ABNORMAL HIGH (ref 70–99)
Potassium: 5.3 mmol/L — ABNORMAL HIGH (ref 3.5–5.2)
Sodium: 137 mmol/L (ref 134–144)
Total Protein: 7 g/dL (ref 6.0–8.5)
eGFR: 100 mL/min/{1.73_m2} (ref >59)

## 2022-09-23 LAB — LIPID PANEL WITH LDL/HDL RATIO
Cholesterol, Total: 174 mg/dL (ref 100–199)
HDL: 42 mg/dL (ref >39)
LDL Chol Calc (NIH): 109 mg/dL — ABNORMAL HIGH (ref 0–99)
LDL/HDL Ratio: 2.6 ratio (ref 0.0–3.6)
Triglycerides: 131 mg/dL (ref 0–149)
VLDL Cholesterol Cal: 23 mg/dL (ref 5–40)

## 2022-09-23 LAB — HEMOGLOBIN A1C
Est. average glucose Bld gHb Est-mCnc: 148 mg/dL
Hgb A1c MFr Bld: 6.8 % — ABNORMAL HIGH (ref 4.8–5.6)

## 2023-01-27 ENCOUNTER — Other Ambulatory Visit: Payer: Self-pay | Admitting: Family Medicine

## 2023-01-27 DIAGNOSIS — I10 Essential (primary) hypertension: Secondary | ICD-10-CM

## 2023-01-28 NOTE — Telephone Encounter (Signed)
Requested Prescriptions  Pending Prescriptions Disp Refills   losartan (COZAAR) 100 MG tablet [Pharmacy Med Name: Losartan Potassium 100 MG Oral Tablet] 90 tablet 0    Sig: TAKE 1 TABLET BY MOUTH DAILY     Cardiovascular:  Angiotensin Receptor Blockers Failed - 01/27/2023 10:02 PM      Failed - K in normal range and within 180 days    Potassium  Date Value Ref Range Status  09/22/2022 5.3 (H) 3.5 - 5.2 mmol/L Final         Passed - Cr in normal range and within 180 days    Creatinine, Ser  Date Value Ref Range Status  09/22/2022 0.91 0.76 - 1.27 mg/dL Final         Passed - Patient is not pregnant      Passed - Last BP in normal range    BP Readings from Last 1 Encounters:  09/22/22 120/78         Passed - Valid encounter within last 6 months    Recent Outpatient Visits           4 months ago Essential hypertension   [facility], [facility]   5 months ago Acute maxillary sinusitis, recurrence not specified   [facility], MD   10 months ago Essential hypertension   [place] [facility], MD   1 year ago Essential hypertension   [place] [facility], MD   1 year ago Essential hypertension   [place] [facility], MD       Future Appointments             In 5 days Duanne Limerick, MD [facility], [facility]

## 2023-02-02 ENCOUNTER — Ambulatory Visit: Payer: 59 | Admitting: Family Medicine

## 2023-02-12 ENCOUNTER — Encounter: Payer: Self-pay | Admitting: Family Medicine

## 2023-02-12 ENCOUNTER — Other Ambulatory Visit
Admission: RE | Admit: 2023-02-12 | Discharge: 2023-02-12 | Disposition: A | Discharge status: Home / Self Care | Payer: 59 | Attending: Family Medicine | Admitting: Family Medicine

## 2023-02-12 ENCOUNTER — Ambulatory Visit (INDEPENDENT_AMBULATORY_CARE_PROVIDER_SITE_OTHER): Payer: 59 | Admitting: Family Medicine

## 2023-02-12 VITALS — BP 118/78 | HR 88 | Ht 72.0 in | Wt 276.0 lb

## 2023-02-12 DIAGNOSIS — E119 Type 2 diabetes mellitus without complications: Secondary | ICD-10-CM

## 2023-02-12 LAB — HEMOGLOBIN A1C
Hgb A1c MFr Bld: 6.6 % — ABNORMAL HIGH (ref 4.8–5.6)
Mean Plasma Glucose: 142.72 mg/dL

## 2023-02-12 NOTE — Progress Notes (Signed)
Date:  02/12/2023   Name:  Erik Valdez   DOB:  02-Dec-1965   MRN:  [id number]   Chief Complaint: Diabetes (No meds-needs recheck on A1C)  Diabetes He presents for his follow-up diabetic visit. He has type 2 diabetes mellitus. His disease course has been stable. There are no hypoglycemic associated symptoms. There are no diabetic associated symptoms. Pertinent negatives for diabetes include no blurred vision, no chest pain, no polydipsia, no polyuria, no visual change and no weight loss. There are no hypoglycemic complications. Symptoms are stable. There are no diabetic complications. There are no known risk factors for coronary artery disease. When asked about current treatments, none were reported. His weight is stable. He is following a generally healthy diet. He participates in exercise daily. An ACE inhibitor/angiotensin II receptor blocker is being taken.    Lab Results  Component Value Date   NA 137 09/22/2022   K 5.3 (H) 09/22/2022   CO2 21 09/22/2022   GLUCOSE 140 (H) 09/22/2022   BUN 15 09/22/2022   CREATININE 0.91 09/22/2022   CALCIUM 9.6 09/22/2022   EGFR 100 09/22/2022   GFRNONAA 92 03/06/2020   Lab Results  Component Value Date   CHOL 174 09/22/2022   HDL 42 09/22/2022   LDLCALC 109 (H) 09/22/2022   TRIG 131 09/22/2022   CHOLHDL 5.1 (H) 01/05/2019   No results found for: "TSH" Lab Results  Component Value Date   HGBA1C 6.8 (H) 09/22/2022   Lab Results  Component Value Date   WBC 7.7 01/09/2021   HGB 15.5 01/09/2021   HCT 45.1 01/09/2021   MCV 91.1 01/09/2021   PLT 641 (H) 01/09/2021   Lab Results  Component Value Date   ALT 24 09/22/2022   AST 18 09/22/2022   ALKPHOS 110 09/22/2022   BILITOT 0.5 09/22/2022   No results found for: "25OHVITD2", "25OHVITD3", "VD25OH"   Review of Systems  Constitutional:  Negative for appetite change, diaphoresis, fever and weight loss.  Eyes:  Negative for blurred vision, redness and visual disturbance.   Respiratory:  Negative for apnea, cough, shortness of breath and wheezing.   Cardiovascular:  Negative for chest pain, palpitations and leg swelling.  Gastrointestinal:  Negative for abdominal distention.  Endocrine: Negative for polydipsia and polyuria.    Patient Active Problem List   Diagnosis Date Noted   Reactive depression 07/13/2019   Anxiety 07/13/2019   Pure hypercholesterolemia 10/13/2017   Stable angina pectoris 10/01/2016   External hemorrhoid 09/28/2016   Mixed hyperlipidemia 09/28/2016   Chronic seasonal allergic rhinitis due to pollen 09/28/2016   Blood in stool    Laboratory animal allergy 10/26/2014   Acute back pain with sciatica 10/26/2014   Routine general medical examination at a health care facility 10/26/2014   Essential hypertension 10/26/2014   Adult BMI 30+ 10/26/2014    No Known Allergies  Past Surgical History:  Procedure Laterality Date   COLONOSCOPY WITH PROPOFOL N/A 01/16/2016   Procedure: COLONOSCOPY WITH PROPOFOL;  Surgeon: Midge Minium, MD;  Location: [facility];  Service: Endoscopy;  Laterality: N/A;   NASAL SINUS SURGERY     age 57 or 57    Social History   Tobacco Use   Smoking status: Never   Smokeless tobacco: Former    Types: Chew    Quit date: 06/10/2018   Tobacco comments:    Smokeless tobacco - occasionally (golfing, etc)  Vaping Use   Vaping status: Never Used  Substance Use Topics  Alcohol use: Yes    Alcohol/week: 6.0 standard drinks of alcohol    Types: 6 Cans of beer per week   Drug use: Never     Medication list has been reviewed and updated.  Current Meds  Medication Sig   amLODipine (NORVASC) 2.5 MG tablet Take 1 tablet (2.5 mg total) by mouth daily.   fluticasone (FLONASE) 50 MCG/ACT nasal spray USE 1 SPRAY IN BOTH  NOSTRILS DAILY   loratadine (CLARITIN) 10 MG tablet Take 1 tablet (10 mg total) by mouth daily.   losartan (COZAAR) 100 MG tablet TAKE 1 TABLET BY MOUTH DAILY   PROCTOSOL HC 2.5 %  rectal cream PLACE 1 APPLICATION  RECTALLY TWICE DAILY   rosuvastatin (CRESTOR) 5 MG tablet Take 1 tablet (5 mg total) by mouth daily.   sertraline (ZOLOFT) 50 MG tablet Take 1 tablet (50 mg total) by mouth daily.       02/12/2023    8:05 AM 09/22/2022    8:22 AM 08/03/2022   11:16 AM 03/13/2022    8:29 AM  GAD 7 : Generalized Anxiety Score  Nervous, Anxious, on Edge 0 0 0 0  Control/stop worrying 0 0 0 0  Worry too much - different things 0 0 0 0  Trouble relaxing 0 0 0 0  Restless 0 0 0 0  Easily annoyed or irritable 0 0 0 0  Afraid - awful might happen 0 0 0 0  Total GAD 7 Score 0 0 0 0  Anxiety Difficulty Not difficult at all Not difficult at all Not difficult at all Not difficult at all       02/12/2023    8:05 AM 09/22/2022    8:22 AM 08/03/2022   11:16 AM  Depression screen PHQ 2/9  Decreased Interest 0 0 0  Down, Depressed, Hopeless 0 0 0  PHQ - 2 Score 0 0 0  Altered sleeping 0 0 0  Tired, decreased energy 0 0 0  Change in appetite 0 0 0  Feeling bad or failure about yourself  0 0 0  Trouble concentrating 0 0 0  Moving slowly or fidgety/restless 0 0 0  Suicidal thoughts 0 0 0  PHQ-9 Score 0 0 0  Difficult doing work/chores Not difficult at all Not difficult at all Not difficult at all    BP Readings from Last 3 Encounters:  02/12/23 118/78  09/22/22 120/78  08/03/22 120/82    Physical Exam Vitals and nursing note reviewed.  HENT:     Head: Normocephalic.  Neck:     Thyroid: No thyromegaly.     Vascular: No JVD.     Trachea: No tracheal deviation.  Cardiovascular:     Rate and Rhythm: Normal rate and regular rhythm.     Heart sounds: Normal heart sounds. No murmur heard.    No friction rub. No gallop.  Pulmonary:     Effort: No respiratory distress.     Breath sounds: Normal breath sounds. No wheezing or rales.  Abdominal:     General: Bowel sounds are normal.     Palpations: Abdomen is soft. There is no mass.     Tenderness: There is no abdominal  tenderness. There is no guarding or rebound.  Musculoskeletal:     Cervical back: Neck supple.  Skin:    General: Skin is warm.     Findings: No rash.  Neurological:     Mental Status: He is alert and oriented to person, place, and time.  Cranial Nerves: No cranial nerve deficit.     Deep Tendon Reflexes: Reflexes are normal and symmetric.     Wt Readings from Last 3 Encounters:  02/12/23 276 lb (125.2 kg)  09/22/22 274 lb (124.3 kg)  08/03/22 270 lb (122.5 kg)    BP 118/78   Pulse 88   Ht 6' (1.829 m)   Wt 276 lb (125.2 kg)   SpO2 95%   BMI 37.43 kg/m   Assessment and Plan: 1. Type 2 diabetes mellitus without complication, without long-term current use of insulin (HCC) Chronic.  Partially controlled.  Patient wanted to try dietary approach and its questionable he stated to do this so we will check an A1c and if not below 7 we will initiate medication therapy. - HgB A1c     Elizabeth Sauer, MD

## 2023-02-13 LAB — MICROALBUMIN / CREATININE URINE RATIO
Creatinine, Urine: 159.1 mg/dL
Microalb Creat Ratio: 2 mg/g{creat} (ref 0–29)
Microalb, Ur: 3.3 ug/mL — ABNORMAL HIGH

## 2023-02-14 ENCOUNTER — Encounter: Payer: Self-pay | Admitting: Family Medicine

## 2023-03-09 ENCOUNTER — Other Ambulatory Visit: Payer: Self-pay | Admitting: Family Medicine

## 2023-03-09 DIAGNOSIS — I10 Essential (primary) hypertension: Secondary | ICD-10-CM

## 2023-03-16 ENCOUNTER — Other Ambulatory Visit: Payer: Self-pay | Admitting: Family Medicine

## 2023-03-16 DIAGNOSIS — E782 Mixed hyperlipidemia: Secondary | ICD-10-CM

## 2023-03-25 ENCOUNTER — Ambulatory Visit: Payer: 59 | Admitting: Family Medicine

## 2023-03-29 ENCOUNTER — Other Ambulatory Visit: Payer: Self-pay | Admitting: Family Medicine

## 2023-03-29 DIAGNOSIS — F329 Major depressive disorder, single episode, unspecified: Secondary | ICD-10-CM

## 2023-04-05 ENCOUNTER — Other Ambulatory Visit: Payer: Self-pay | Admitting: Family Medicine

## 2023-04-05 DIAGNOSIS — I10 Essential (primary) hypertension: Secondary | ICD-10-CM

## 2023-05-16 ENCOUNTER — Other Ambulatory Visit: Payer: Self-pay | Admitting: Family Medicine

## 2023-05-16 DIAGNOSIS — I10 Essential (primary) hypertension: Secondary | ICD-10-CM

## 2023-06-05 ENCOUNTER — Other Ambulatory Visit: Payer: Self-pay | Admitting: Family Medicine

## 2023-06-05 DIAGNOSIS — F329 Major depressive disorder, single episode, unspecified: Secondary | ICD-10-CM

## 2023-06-12 ENCOUNTER — Other Ambulatory Visit: Payer: Self-pay | Admitting: Family Medicine

## 2023-06-12 DIAGNOSIS — I10 Essential (primary) hypertension: Secondary | ICD-10-CM

## 2023-06-12 IMAGING — CR DG CHEST 2V
2 series · 2 of 2 positions shown · non-contrast
Comparison: 12/27/2020

CLINICAL DATA: Cough and follow-up pneumonia.

EXAM:
CHEST - 2 VIEW

[chest pa]
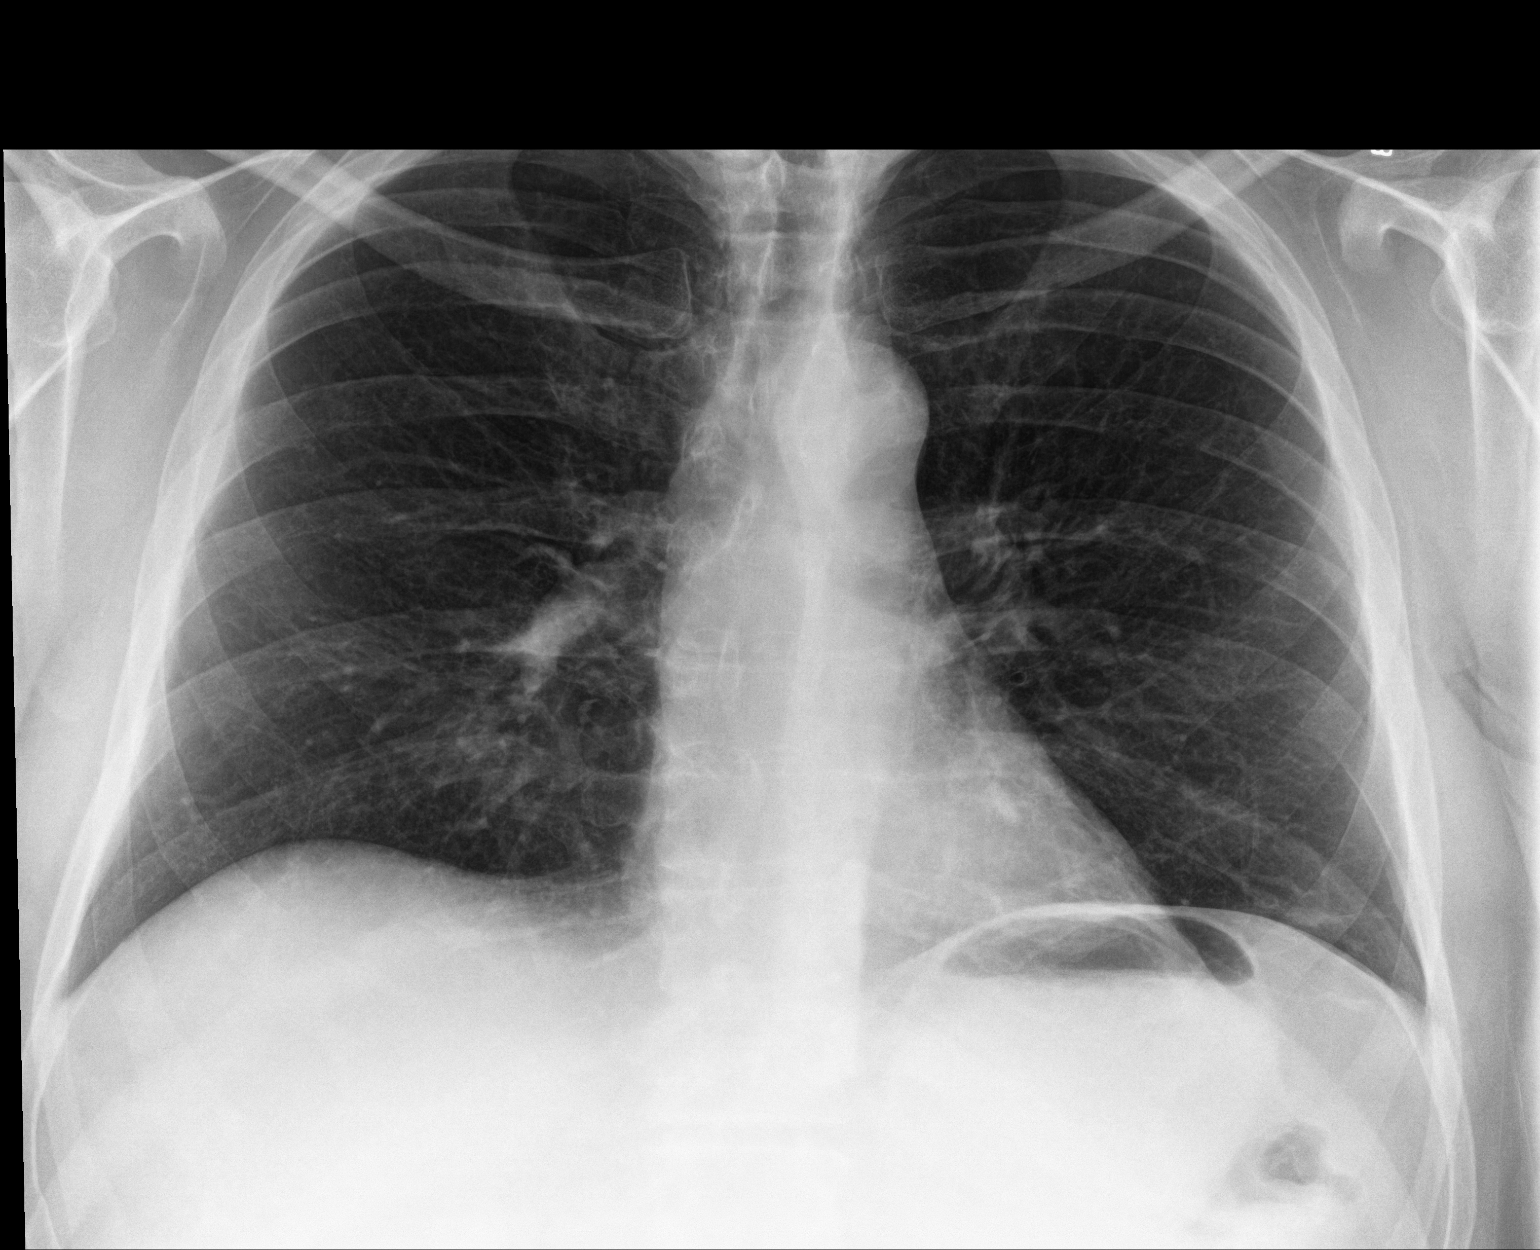

[chest lat]
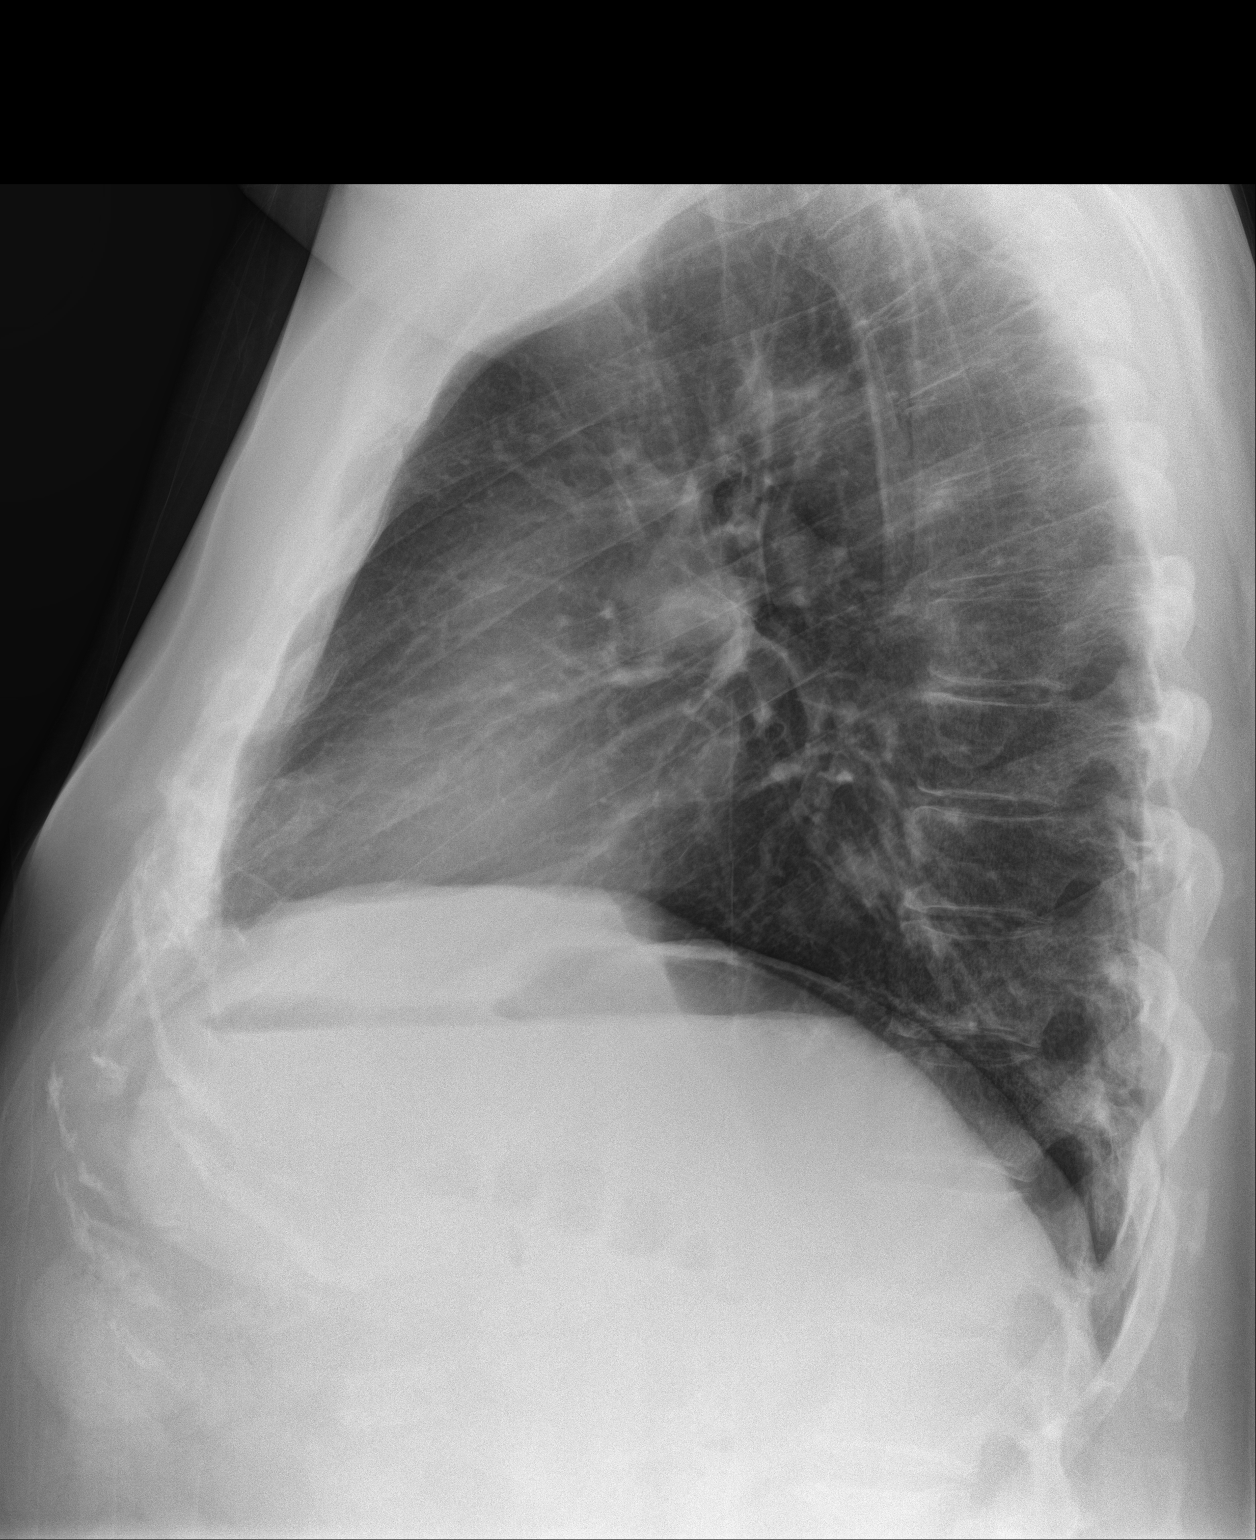

[2 of 2 positions shown; findings below may reference images not displayed]

FINDINGS: Densities in left lower lung have resolved. Both lungs are now
clear. Heart and mediastinum are within normal limits. No large
pleural effusions. No acute bone abnormality.
IMPRESSION: No active cardiopulmonary disease. Left lung densities have
resolved.

## 2023-08-18 ENCOUNTER — Other Ambulatory Visit: Payer: Self-pay | Admitting: Family Medicine

## 2023-08-18 DIAGNOSIS — E782 Mixed hyperlipidemia: Secondary | ICD-10-CM

## 2023-10-14 ENCOUNTER — Other Ambulatory Visit: Payer: Self-pay | Admitting: Family Medicine

## 2023-10-14 DIAGNOSIS — E782 Mixed hyperlipidemia: Secondary | ICD-10-CM

## 2024-01-06 ENCOUNTER — Other Ambulatory Visit: Payer: Self-pay | Admitting: Physician Assistant

## 2024-01-06 DIAGNOSIS — E782 Mixed hyperlipidemia: Secondary | ICD-10-CM

## 2024-01-06 NOTE — Telephone Encounter (Unsigned)
 Copied from CRM [id number]. Topic: Clinical - Medication Refill >> Jan 06, 2024  1:46 PM Fonda T wrote: Medication: rosuvastatin  (CRESTOR ) 5 MG tablet  Previous patient of Dr. Joshua  Has the patient contacted their pharmacy? Yes, per pharmacy advised patient request submitted., with no response.  This is the patient's preferred pharmacy:   [id number] - [place], [place] - [address] [place] [address] Phone: [phone] Fax: [phone]   Is this the correct pharmacy for this prescription? Yes If no, delete pharmacy and type the correct one.   Has the prescription been filled recently? Yes  Is the patient out of the medication? Yes  Has the patient been seen for an appointment in the last year OR does the patient have an upcoming appointment? Yes  Can we respond through MyChart? No, patient prefers phone call with status update  Agent: Please be advised that Rx refills may take up to 3 business days. We ask that you follow-up with your pharmacy.

## 2024-01-07 MED ORDER — ROSUVASTATIN CALCIUM 5 MG PO TABS: 5.0000 mg | ORAL_TABLET | Freq: Every day | ORAL | 0 refills | Status: DC

## 2024-01-07 NOTE — Telephone Encounter (Signed)
 Requested medications are due for refill today.  yes  Requested medications are on the active medications list.  yes  Last refill. 10/14/2023 #30 1 rf  Future visit scheduled.   yes  Notes to clinic.  Dr. Joshua pt. - POC to [employer] [facility]  Pending Prescriptions Disp Refills   rosuvastatin  (CRESTOR ) 5 MG tablet 30 tablet 1    Sig: Take 1 tablet (5 mg total) by mouth daily.     Cardiovascular:  Antilipid - Statins 2 Failed - 01/07/2024  1:59 PM      Failed - Cr in normal range and within 360 days    Creatinine, Ser  Date Value Ref Range Status  09/22/2022 0.91 0.76 - 1.27 mg/dL Final         Failed - Valid encounter within last 12 months    Recent Outpatient Visits   None            Failed - Lipid Panel in normal range within the last 12 months    Cholesterol, Total  Date Value Ref Range Status  09/22/2022 174 100 - 199 mg/dL Final   LDL Chol Calc (NIH)  Date Value Ref Range Status  09/22/2022 109 (H) 0 - 99 mg/dL Final   HDL  Date Value Ref Range Status  09/22/2022 42 >39 mg/dL Final   Triglycerides  Date Value Ref Range Status  09/22/2022 131 0 - 149 mg/dL Final         Passed - Patient is not pregnant

## 2024-01-13 ENCOUNTER — Encounter: Payer: Self-pay | Admitting: Physician Assistant

## 2024-01-21 ENCOUNTER — Encounter: Payer: Self-pay | Admitting: Physician Assistant

## 2024-01-21 ENCOUNTER — Ambulatory Visit: Payer: Self-pay | Admitting: Physician Assistant

## 2024-01-21 VITALS — BP 124/88 | HR 89 | Temp 98.4°F | Ht 72.0 in | Wt 270.0 lb

## 2024-01-21 DIAGNOSIS — K648 Other hemorrhoids: Secondary | ICD-10-CM

## 2024-01-21 DIAGNOSIS — E782 Mixed hyperlipidemia: Secondary | ICD-10-CM | POA: Diagnosis not present

## 2024-01-21 DIAGNOSIS — I10 Essential (primary) hypertension: Secondary | ICD-10-CM

## 2024-01-21 DIAGNOSIS — E1169 Type 2 diabetes mellitus with other specified complication: Secondary | ICD-10-CM

## 2024-01-21 DIAGNOSIS — F329 Major depressive disorder, single episode, unspecified: Secondary | ICD-10-CM

## 2024-01-21 LAB — HEMOGLOBIN A1C: Hemoglobin A1C: 6.6

## 2024-01-21 MED ORDER — SERTRALINE HCL 50 MG PO TABS: 50.0000 mg | ORAL_TABLET | Freq: Every day | ORAL | 1 refills | Status: DC

## 2024-01-21 MED ORDER — LOSARTAN POTASSIUM 100 MG PO TABS: 100.0000 mg | ORAL_TABLET | Freq: Every day | ORAL | 1 refills | Status: AC

## 2024-01-21 MED ORDER — ROSUVASTATIN CALCIUM 5 MG PO TABS: 5.0000 mg | ORAL_TABLET | Freq: Every day | ORAL | 1 refills | Status: AC

## 2024-01-21 MED ORDER — HYDROCORTISONE (PERIANAL) 2.5 % EX CREA: TOPICAL_CREAM | Freq: Two times a day (BID) | CUTANEOUS | 0 refills | Status: AC

## 2024-01-21 NOTE — Progress Notes (Signed)
 Date:  01/21/2024   Name:  Erik Valdez   DOB:  [birth year]   MRN:  [id number]   Chief Complaint: Medical Management of Chronic Issues  HPI [name] is a pleasant 58 year old male with history of HTN, HLD, diet-controlled DM2, depression, obesity who presents new to me today as a transfer of care from my recently retired Animator Dr. Cathryne Molt.  He is overdue for routine care, last seen here about a year ago, A1c 6.6% at the time.  Requesting refills on most of his medications today.  Overall feels well, no acute complaints.  Chronic internal hemorrhoids which flare up occasionally, improved with Proctosol cream.  This is confirmed on colonoscopy from 2017 which was otherwise unremarkable.   Medication list has been reviewed and updated.  Current Meds  Medication Sig   amLODipine  (NORVASC ) 2.5 MG tablet TAKE 1 TABLET BY MOUTH DAILY   loratadine  (CLARITIN ) 10 MG tablet Take 1 tablet (10 mg total) by mouth daily.   [DISCONTINUED] fluticasone  (FLONASE ) 50 MCG/ACT nasal spray USE 1 SPRAY IN BOTH  NOSTRILS DAILY   [DISCONTINUED] losartan  (COZAAR ) 100 MG tablet TAKE 1 TABLET BY MOUTH DAILY   [DISCONTINUED] PROCTOSOL HC 2.5 % rectal cream PLACE 1 APPLICATION  RECTALLY TWICE DAILY   [DISCONTINUED] rosuvastatin  (CRESTOR ) 5 MG tablet Take 1 tablet (5 mg total) by mouth daily.   [DISCONTINUED] sertraline  (ZOLOFT ) 50 MG tablet TAKE 1 TABLET BY MOUTH DAILY     Review of Systems  Patient Active Problem List   Diagnosis Date Noted   Diet-controlled Type 2 diabetes mellitus with other specified complication (HCC) 01/21/2024   Internal hemorrhoids 01/21/2024   Reactive depression 07/13/2019   Anxiety 07/13/2019   Mixed hyperlipidemia 09/28/2016   Chronic seasonal allergic rhinitis due to pollen 09/28/2016   Laboratory animal allergy 10/26/2014   Essential hypertension 10/26/2014   Class 2 severe obesity with serious comorbidity in adult (HCC) 10/26/2014    No Known  Allergies  Immunization History  Administered Date(s) Administered   PFIZER(Purple Top)SARS-COV-2 Vaccination 12/29/2019, 01/19/2020   Pneumococcal Polysaccharide-23 10/17/2012   Td 06/22/1998   Tdap 10/17/2012    Past Surgical History:  Procedure Laterality Date   COLONOSCOPY WITH PROPOFOL  N/A 01/16/2016   Procedure: COLONOSCOPY WITH PROPOFOL ;  Surgeon: Rogelia Copping, MD;  Location: [facility];  Service: Endoscopy;  Laterality: N/A;   NASAL SINUS SURGERY     age 29 or 38    Social History   Tobacco Use   Smoking status: Never   Smokeless tobacco: Former    Types: Chew    Quit date: 06/10/2018   Tobacco comments:    Smokeless tobacco - occasionally (golfing, etc)  Vaping Use   Vaping status: Never Used  Substance Use Topics   Alcohol use: Yes    Alcohol/week: 6.0 standard drinks of alcohol    Types: 6 Cans of beer per week    Comment: socially   Drug use: Never    Family History  Problem Relation Age of Onset   Hypertension Mother    Hypertension Sister    Hypertension Maternal Grandfather         02/12/2023    8:05 AM 09/22/2022    8:22 AM 08/03/2022   11:16 AM 03/13/2022    8:29 AM  GAD 7 : Generalized Anxiety Score  Nervous, Anxious, on Edge 0 0 0 0  Control/stop worrying 0 0 0 0  Worry too much - different things 0 0 0 0  Trouble  relaxing 0 0 0 0  Restless 0 0 0 0  Easily annoyed or irritable 0 0 0 0  Afraid - awful might happen 0 0 0 0  Total GAD 7 Score 0 0 0 0  Anxiety Difficulty Not difficult at all Not difficult at all Not difficult at all Not difficult at all       02/12/2023    8:05 AM 09/22/2022    8:22 AM 08/03/2022   11:16 AM  Depression screen PHQ 2/9  Decreased Interest 0 0 0  Down, Depressed, Hopeless 0 0 0  PHQ - 2 Score 0 0 0  Altered sleeping 0 0 0  Tired, decreased energy 0 0 0  Change in appetite 0 0 0  Feeling bad or failure about yourself  0 0 0  Trouble concentrating 0 0 0  Moving slowly or fidgety/restless 0 0 0   Suicidal thoughts 0 0 0  PHQ-9 Score 0 0 0  Difficult doing work/chores Not difficult at all Not difficult at all Not difficult at all    BP Readings from Last 3 Encounters:  01/21/24 124/88  02/12/23 118/78  09/22/22 120/78    Wt Readings from Last 3 Encounters:  01/21/24 270 lb (122.5 kg)  02/12/23 276 lb (125.2 kg)  09/22/22 274 lb (124.3 kg)    BP 124/88   Pulse 89   Temp 98.4 F (36.9 C)   Ht 6' (1.829 m)   Wt 270 lb (122.5 kg)   SpO2 96%   BMI 36.62 kg/m   Physical Exam Vitals and nursing note reviewed.  Constitutional:      Appearance: Normal appearance.  Neck:     Vascular: No carotid bruit.  Cardiovascular:     Rate and Rhythm: Normal rate and regular rhythm.     Heart sounds: No murmur heard.    No friction rub. No gallop.  Pulmonary:     Effort: Pulmonary effort is normal.     Breath sounds: Normal breath sounds.  Abdominal:     General: There is no distension.  Musculoskeletal:        General: Normal range of motion.  Skin:    General: Skin is warm and dry.  Neurological:     Mental Status: He is alert and oriented to person, place, and time.     Gait: Gait is intact.  Psychiatric:        Mood and Affect: Mood and affect normal.     Recent Labs     Component Value Date/Time   NA 137 09/22/2022 0900   K 5.3 (H) 09/22/2022 0900   CL 101 09/22/2022 0900   CO2 21 09/22/2022 0900   GLUCOSE 140 (H) 09/22/2022 0900   BUN 15 09/22/2022 0900   CREATININE 0.91 09/22/2022 0900   CALCIUM  9.6 09/22/2022 0900   PROT 7.0 09/22/2022 0900   ALBUMIN 4.4 09/22/2022 0900   AST 18 09/22/2022 0900   ALT 24 09/22/2022 0900   ALKPHOS 110 09/22/2022 0900   BILITOT 0.5 09/22/2022 0900   GFRNONAA 92 03/06/2020 0902   GFRAA 106 03/06/2020 0902    Lab Results  Component Value Date   WBC 7.7 01/09/2021   HGB 15.5 01/09/2021   HCT 45.1 01/09/2021   MCV 91.1 01/09/2021   PLT 641 (H) 01/09/2021   Lab Results  Component Value Date   HGBA1C 6.6 (H)  02/12/2023   HGBA1C 6.8 (H) 09/22/2022   HGBA1C 6.5 (H) 03/17/2022   Lab Results  Component Value Date   CHOL 174 09/22/2022   HDL 42 09/22/2022   LDLCALC 109 (H) 09/22/2022   TRIG 131 09/22/2022   CHOLHDL 5.1 (H) 01/05/2019   No results found for: TSH    Assessment and Plan:  Diet-controlled Type 2 diabetes mellitus with other specified complication, without long-term current use of insulin (HCC) Assessment & Plan: A1c 6.6% today reflecting ongoing stable glycemic control.  Okay to continue without medication for now, recheck at next physical where we will also complete foot exam and microalbumin   Reactive depression Assessment & Plan: Refill sertraline  50 mg daily.  Consider taper in the future if patient agreeable.  Orders: -     Sertraline  HCl; Take 1 tablet (50 mg total) by mouth daily.  Dispense: 90 tablet; Refill: 1  Essential hypertension Assessment & Plan: Well-controlled with current regimen, refill losartan  100 mg.  Patient states he still has 20 of amlodipine  at this time, but will let me know when due for refill.  Orders: -     Losartan  Potassium; Take 1 tablet (100 mg total) by mouth daily.  Dispense: 90 tablet; Refill: 1  Mixed hyperlipidemia Assessment & Plan: Continue low-dose rosuvastatin .  Plan for fasting lipids next time.  Orders: -     Rosuvastatin  Calcium ; Take 1 tablet (5 mg total) by mouth daily.  Dispense: 90 tablet; Refill: 1  Internal hemorrhoids -     Hydrocortisone  (Perianal); Place rectally 2 (two) times daily.  Dispense: 28.35 g; Refill: 0     Return in about 3 months (around 05/02/2024) for fasting CPE.    Rolan Hoyle, PA-C, DMSc, Nutritionist [facility] [phone]

## 2024-01-21 NOTE — Assessment & Plan Note (Signed)
 Continue low-dose rosuvastatin .  Plan for fasting lipids next time.

## 2024-01-21 NOTE — Assessment & Plan Note (Signed)
 Refill sertraline  50 mg daily.  Consider taper in the future if patient agreeable.

## 2024-01-21 NOTE — Assessment & Plan Note (Addendum)
 A1c 6.6% today reflecting ongoing stable glycemic control.  Okay to continue without medication for now, recheck at next physical where we will also complete foot exam and microalbumin

## 2024-01-21 NOTE — Assessment & Plan Note (Signed)
 Well-controlled with current regimen, refill losartan  100 mg.  Patient states he still has 20 of amlodipine  at this time, but will let me know when due for refill.

## 2024-02-02 ENCOUNTER — Ambulatory Visit
Admission: RE | Admit: 2024-02-02 | Discharge: 2024-02-02 | Disposition: A | Discharge status: Home / Self Care | Attending: Emergency Medicine | Admitting: Emergency Medicine

## 2024-02-02 ENCOUNTER — Ambulatory Visit (HOSPITAL_COMMUNITY)

## 2024-02-02 VITALS — BP 106/76 | HR 80 | Temp 98.0°F | Resp 18

## 2024-02-02 DIAGNOSIS — L282 Other prurigo: Secondary | ICD-10-CM

## 2024-02-02 DIAGNOSIS — L259 Unspecified contact dermatitis, unspecified cause: Secondary | ICD-10-CM | POA: Diagnosis not present

## 2024-02-02 MED ORDER — PREDNISONE 10 MG (21) PO TBPK: ORAL_TABLET | Freq: Every day | ORAL | 0 refills | Status: DC

## 2024-02-02 MED ORDER — DEXAMETHASONE SODIUM PHOSPHATE 10 MG/ML IJ SOLN
10.0000 mg | Freq: Once | INTRAMUSCULAR | Status: AC
  Administered 2024-02-02 (×2): 10 mg via INTRAMUSCULAR

## 2024-02-02 MED ORDER — CETIRIZINE HCL 10 MG PO TABS: 10.0000 mg | ORAL_TABLET | Freq: Every day | ORAL | 0 refills | Status: DC

## 2024-02-02 NOTE — Discharge Instructions (Addendum)
You were given an injection of a steroid called dexamethasone.  Start the prednisone taper tomorrow as directed.    Take Zyrtec as directed.    Follow up with your primary care provider.    

## 2024-02-02 NOTE — ED Provider Notes (Signed)
 Erik Valdez    CSN: [id number] Arrival date & time: 02/02/24  1051      History   Chief Complaint Chief Complaint  Patient presents with   Rash    Rash started last Thursday. I have tried Aveeno, Advil hives medicine along with hydrocortisone  cream to no avail. - Entered by patient    HPI Erik Valdez is a 58 y.o. male.  Patient presents with 1 week history of pruritic rash on his trunk and extremities.  The rash started on his right arm and has spread.  He now has it in his scalp also.  No new products, medications, foods.  No one at home with rash.  No difficulty swallowing or breathing.  Treatment attempted with hydrocortisone  cream, Aveeno lotion, OTC hive medication (unknown name).  Patient also takes Claritin  daily for allergies.    The history is provided by the patient and medical records.    Past Medical History:  Diagnosis Date   History of colonoscopy 12/21/2015   internal hemorrhoids- cleared for 10 years- Dr Jinny   Hypertension    Stable angina pectoris (HCC) 10/01/2016    Patient Active Problem List   Diagnosis Date Noted   Diet-controlled Type 2 diabetes mellitus with other specified complication (HCC) 01/21/2024   Internal hemorrhoids 01/21/2024   Reactive depression 07/13/2019   Anxiety 07/13/2019   Mixed hyperlipidemia 09/28/2016   Chronic seasonal allergic rhinitis due to pollen 09/28/2016   Laboratory animal allergy 10/26/2014   Essential hypertension 10/26/2014   Class 2 severe obesity with serious comorbidity in adult [facility]) 10/26/2014    Past Surgical History:  Procedure Laterality Date   COLONOSCOPY WITH PROPOFOL  N/A 01/16/2016   Procedure: COLONOSCOPY WITH PROPOFOL ;  Surgeon: Rogelia Jinny, MD;  Location: [facility];  Service: Endoscopy;  Laterality: N/A;   NASAL SINUS SURGERY     age 31 or 8       Home Medications    Prior to Admission medications   Medication Sig Start Date End Date Taking? Authorizing Provider   cetirizine  (ZYRTEC  ALLERGY) 10 MG tablet Take 1 tablet (10 mg total) by mouth daily for 14 days. 02/02/24 02/16/24 Yes Corlis Burnard DEL, NP  predniSONE  (STERAPRED UNI-PAK 21 TAB) 10 MG (21) TBPK tablet Take by mouth daily. As directed 02/03/24  Yes Corlis Burnard DEL, NP  amLODipine  (NORVASC ) 2.5 MG tablet TAKE 1 TABLET BY MOUTH DAILY 05/17/23   Jones, Deanna C, MD  hydrocortisone  (PROCTOSOL HC) 2.5 % rectal cream Place rectally 2 (two) times daily. 01/21/24   Manya Toribio SQUIBB, PA  loratadine  (CLARITIN ) 10 MG tablet Take 1 tablet (10 mg total) by mouth daily. 09/22/22   Joshua Cathryne BROCKS, MD  losartan  (COZAAR ) 100 MG tablet Take 1 tablet (100 mg total) by mouth daily. 01/21/24   Manya Toribio SQUIBB, PA  rosuvastatin  (CRESTOR ) 5 MG tablet Take 1 tablet (5 mg total) by mouth daily. 01/21/24   Manya Toribio SQUIBB, PA  sertraline  (ZOLOFT ) 50 MG tablet Take 1 tablet (50 mg total) by mouth daily. 01/21/24   Manya Toribio SQUIBB, PA    Family History Family History  Problem Relation Age of Onset   Hypertension Mother    Hypertension Sister    Hypertension Maternal Grandfather     Social History Social History   Tobacco Use   Smoking status: Never   Smokeless tobacco: Former    Types: Chew    Quit date: 06/10/2018   Tobacco comments:    Smokeless tobacco -  occasionally (golfing, etc)  Vaping Use   Vaping status: Never Used  Substance Use Topics   Alcohol use: Yes    Alcohol/week: 6.0 standard drinks of alcohol    Types: 6 Cans of beer per week    Comment: socially   Drug use: Never     Allergies   Patient has no known allergies.   Review of Systems Review of Systems  Constitutional:  Negative for chills and fever.  HENT:  Negative for sore throat, trouble swallowing and voice change.   Respiratory:  Negative for cough and shortness of breath.   Skin:  Positive for rash. Negative for color change.     Physical Exam Triage Vital Signs ED Triage Vitals  Encounter Vitals Group     BP 02/02/24 1119  106/76     Girls Systolic BP Percentile --      Girls Diastolic BP Percentile --      Boys Systolic BP Percentile --      Boys Diastolic BP Percentile --      Pulse Rate 02/02/24 1119 80     Resp 02/02/24 1119 18     Temp 02/02/24 1119 98 F (36.7 C)     Temp src --      SpO2 02/02/24 1119 98 %     Weight --      Height --      Head Circumference --      Peak Flow --      Pain Score 02/02/24 1114 0     Pain Loc --      Pain Education --      Exclude from Growth Chart --    No data found.  Updated Vital Signs BP 106/76   Pulse 80   Temp 98 F (36.7 C)   Resp 18   SpO2 98%   Visual Acuity Right Eye Distance:   Left Eye Distance:   Bilateral Distance:    Right Eye Near:   Left Eye Near:    Bilateral Near:     Physical Exam Constitutional:      General: He is not in acute distress. HENT:     Mouth/Throat:     Mouth: Mucous membranes are moist.  Cardiovascular:     Rate and Rhythm: Normal rate and regular rhythm.  Pulmonary:     Effort: Pulmonary effort is normal. No respiratory distress.  Skin:    General: Skin is warm and dry.     Findings: Rash present.     Comments: Wide-spread maculopapular rash on trunk and extremities. Many lesions scratched open and scabbed.  No purulent drainage.   Neurological:     Mental Status: He is alert.      UC Treatments / Results  Labs (all labs ordered are listed, but only abnormal results are displayed) Labs Reviewed - No data to display  EKG   Radiology No results found.  Procedures Procedures (including critical care time)  Medications Ordered in UC Medications  dexamethasone  (DECADRON ) injection 10 mg (has no administration in time range)    Initial Impression / Assessment and Plan / UC Course  I have reviewed the triage vital signs and the nursing notes.  Pertinent labs & imaging results that were available during my care of the patient were reviewed by me and considered in my medical decision making  (see chart for details).    Pruritic rash, contact dermatitis.  Afebrile and vital signs are stable.  Dexamethasone  given here and starting  prednisone  taper tomorrow.  Also treating with 14-day course of Zyrtec .  Instructed patient to pause Claritin  while taking Zyrtec .  Education provided on contact dermatitis.  Instructed him to follow-up with his PCP if he is not improving.  He agrees to plan of care.  Final Clinical Impressions(s) / UC Diagnoses   Final diagnoses:  Pruritic rash  Contact dermatitis, unspecified contact dermatitis type, unspecified trigger     Discharge Instructions      You were given an injection of a steroid called dexamethasone .  Start the prednisone  taper tomorrow as directed.    Take Zyrtec  as directed.    Follow up with your primary care provider.       ED Prescriptions     Medication Sig Dispense Auth. Provider   predniSONE  (STERAPRED UNI-PAK 21 TAB) 10 MG (21) TBPK tablet Take by mouth daily. As directed 21 tablet Corlis Burnard DEL, NP   cetirizine  (ZYRTEC  ALLERGY) 10 MG tablet Take 1 tablet (10 mg total) by mouth daily for 14 days. 14 tablet Corlis Burnard DEL, NP      PDMP not reviewed this encounter.   Corlis Burnard DEL, NP 02/02/24 1140

## 2024-02-02 NOTE — ED Triage Notes (Signed)
 Patient to Urgent Care with complaints of an itchy rash- present to bilateral arms/ legs/ bod/ scalp. Unable to sleep.   Symptoms x1 week. Denies any new product usage.   Attempted use of aveeno lotion/ hydrocortisone  cream/ advil hives medication with little relief.

## 2024-02-08 ENCOUNTER — Telehealth: Admitting: Physician Assistant

## 2024-02-08 DIAGNOSIS — L739 Follicular disorder, unspecified: Secondary | ICD-10-CM | POA: Diagnosis not present

## 2024-02-08 MED ORDER — HYDROXYZINE PAMOATE 25 MG PO CAPS: 25.0000 mg | ORAL_CAPSULE | Freq: Three times a day (TID) | ORAL | 0 refills | Status: AC | PRN

## 2024-02-08 MED ORDER — CEPHALEXIN 500 MG PO CAPS: 500.0000 mg | ORAL_CAPSULE | Freq: Three times a day (TID) | ORAL | 0 refills | Status: AC

## 2024-02-08 NOTE — Progress Notes (Signed)
 I have spent 5 minutes in review of e-visit questionnaire, review and updating patient chart, medical decision making and response to patient.   Elsie Velma Lunger, PA-C

## 2024-02-08 NOTE — Progress Notes (Signed)
 E Visit for Rash  We are sorry that you are not feeling well. Here is how we plan to help!   Based upon what you have shared with me it looks like you have a bacterial follicultits.  Folliculitis is inflammation of the hair follicles that can be caused by a superficial infection of the skin and is treated with an antibiotic. I have prescribed: and Keflex  500 mg three times per day for 7 days I have sent in Hydroxyzine  to take the place of your OTC antihistamine until this resolved.    HOME CARE:  Take cool showers and avoid direct sunlight. Apply cool compress or wet dressings. Take a bath in an oatmeal bath.  Sprinkle content of one Aveeno packet under running faucet with comfortably warm water .  Bathe for 15-20 minutes, 1-2 times daily.  Pat dry with a towel. Do not rub the rash. Use hydrocortisone  cream. Take an antihistamine like Benadryl for widespread rashes that itch.  The adult dose of Benadryl is 25-50 mg by mouth 4 times daily. Caution:  This type of medication may cause sleepiness.  Do not drink alcohol, drive, or operate dangerous machinery while taking antihistamines.  Do not take these medications if you have prostate enlargement.  Read package instructions thoroughly on all medications that you take.  GET HELP RIGHT AWAY IF:  Symptoms don't go away after treatment. Severe itching that persists. If you rash spreads or swells. If you rash begins to smell. If it blisters and opens or develops a yellow-brown crust. You develop a fever. You have a sore throat. You become short of breath.  MAKE SURE YOU:  Understand these instructions. Will watch your condition. Will get help right away if you are not doing well or get worse.  Thank you for choosing an e-visit.  Your e-visit answers were reviewed by a board certified advanced clinical practitioner to complete your personal care plan. Depending upon the condition, your plan could have included both over the counter or  prescription medications.  Please review your pharmacy choice. Make sure the pharmacy is open so you can pick up prescription now. If there is a problem, you may contact your provider through [employer] and have the prescription routed to another pharmacy.  Your safety is important to us . If you have drug allergies check your prescription carefully.   For the next 24 hours you can use MyChart to ask questions about today's visit, request a non-urgent call back, or ask for a work or school excuse. You will get an email in the next two days asking about your experience. I hope that your e-visit has been valuable and will speed your recovery.

## 2024-03-03 ENCOUNTER — Telehealth: Admitting: Emergency Medicine

## 2024-03-03 DIAGNOSIS — R21 Rash and other nonspecific skin eruption: Secondary | ICD-10-CM | POA: Diagnosis not present

## 2024-03-03 MED ORDER — TRIAMCINOLONE ACETONIDE 0.1 % EX CREA: 1.0000 | TOPICAL_CREAM | Freq: Two times a day (BID) | CUTANEOUS | 0 refills | Status: AC

## 2024-03-03 NOTE — Progress Notes (Signed)
 E Visit for Rash  We are sorry that you are not feeling well. Here is how we plan to help!  I am prescribing triamcinolone  0.1 % cream -- apply to the affected area(s) in a thin layer, twice daily for up to 14 days. Do not apply to face, privates or armpit regions.            HOME CARE:  Take cool showers and avoid direct sunlight. Apply cool compress or wet dressings. Take a bath in an oatmeal bath.  Sprinkle content of one Aveeno packet under running faucet with comfortably warm water .  Bathe for 15-20 minutes, 1-2 times daily.  Pat dry with a towel. Do not rub the rash. Use hydrocortisone  cream. Take an antihistamine like Benadryl for widespread rashes that itch.  The adult dose of Benadryl is 25-50 mg by mouth 4 times daily. Caution:  This type of medication may cause sleepiness.  Do not drink alcohol, drive, or operate dangerous machinery while taking antihistamines.  Do not take these medications if you have prostate enlargement.  Read package instructions thoroughly on all medications that you take.  GET HELP RIGHT AWAY IF:  Symptoms don't go away after treatment. Severe itching that persists. If you rash spreads or swells. If you rash begins to smell. If it blisters and opens or develops a yellow-brown crust. You develop a fever. You have a sore throat. You become short of breath.  MAKE SURE YOU:  Understand these instructions. Will watch your condition. Will get help right away if you are not doing well or get worse.  Thank you for choosing an e-visit.  Your e-visit answers were reviewed by a board certified advanced clinical practitioner to complete your personal care plan. Depending upon the condition, your plan could have included both over the counter or prescription medications.  Please review your pharmacy choice. Make sure the pharmacy is open so you can pick up prescription now. If there is a problem, you may contact your provider through [employer] and have the prescription routed to another pharmacy.  Your safety is important to us . If you have drug allergies check your prescription carefully.   For the next 24 hours you can use MyChart to ask questions about today's visit, request a non-urgent call back, or ask for a work or school excuse. You will get an email in the next two days asking about your experience. I hope that your e-visit has been valuable and will speed your recovery.  Approximately 5 minutes was used in reviewing the patient's chart, questionnaire, prescribing medications, and documentation.

## 2024-03-16 ENCOUNTER — Ambulatory Visit (INDEPENDENT_AMBULATORY_CARE_PROVIDER_SITE_OTHER): Admitting: Dermatology

## 2024-03-16 ENCOUNTER — Encounter: Payer: Self-pay | Admitting: Dermatology

## 2024-03-16 DIAGNOSIS — R21 Rash and other nonspecific skin eruption: Secondary | ICD-10-CM | POA: Diagnosis not present

## 2024-03-16 DIAGNOSIS — L905 Scar conditions and fibrosis of skin: Secondary | ICD-10-CM | POA: Diagnosis not present

## 2024-03-16 DIAGNOSIS — L858 Other specified epidermal thickening: Secondary | ICD-10-CM

## 2024-03-16 MED ORDER — CLOBETASOL PROPIONATE 0.05 % EX CREA: 1.0000 | TOPICAL_CREAM | Freq: Two times a day (BID) | CUTANEOUS | 2 refills | Status: AC

## 2024-03-16 NOTE — Progress Notes (Signed)
   New Patient Visit   Subjective  Erik Valdez is a 58 y.o. male who presents for the following: Rash. Thighs, back. Dur: 3-4 weeks. Has had IMK, oral prednisone , hydroxyzine , and topical corticosteroid cream (triamcinolone ). Steroid cream helps. Itching. States has sensitive skin. No new medications, no new laundry detergent.  Dx with contact dermatitis then folliculitis. Has seen a doctor and also had a virtual visit.    The following portions of the chart were reviewed this encounter and updated as appropriate: medications, allergies, medical history  Review of Systems:  No other skin or systemic complaints except as noted in HPI or Assessment and Plan.  Objective  Well appearing patient in no apparent distress; mood and affect are within normal limits.  A focused examination was performed of the following areas: Arms, back, legs  Relevant exam findings are noted in the Assessment and Plan.  Right lateral thigh Scattered erythematous papules, many excoriated, at B/L thighs, buttock, abdomen, back. Pink edematous plaques on back   Assessment & Plan   RASH Right lateral thigh Clinically eczematous dermatitis 15-20% BSA Skin / nail biopsy - Right lateral thigh Type of biopsy: tangential   Informed consent: discussed and consent obtained   Timeout: patient name, date of birth, surgical site, and procedure verified   Procedure prep:  Patient was prepped and draped in usual sterile fashion Prep type:  Isopropyl alcohol Anesthesia: the lesion was anesthetized in a standard fashion   Anesthetic:  1% lidocaine  w/ epinephrine 1-100,000 buffered w/ 8.4% NaHCO3 Instrument used: DermaBlade   Hemostasis achieved with: pressure and aluminum chloride   Outcome: patient tolerated procedure well   Post-procedure details: sterile dressing applied and wound care instructions given   Dressing type: bandage and petrolatum    Specimen 1 - Surgical pathology Differential Diagnosis: prurigo  nodule vs LSC  Check Margins: No Related Medications clobetasol  cream (TEMOVATE ) 0.05 % Apply 1 Application topically 2 (two) times daily. Apply to rash until smooth then stop. Avoid face armpits groin   Return for follow up pending pathology results.  I, Jill Parcell, CMA, am acting as scribe for Boneta Sharps, MD.   Documentation: I have reviewed the above documentation for accuracy and completeness, and I agree with the above.  Boneta Sharps, MD

## 2024-03-16 NOTE — Patient Instructions (Signed)
 Wound Care Instructions  Cleanse wound gently with soap and water once a day then pat dry with clean gauze. Apply a thin coat of Petrolatum (petroleum jelly, Vaseline) over the wound (unless you have an allergy to this). We recommend that you use a new, sterile tube of Vaseline. Do not pick or remove scabs. Do not remove the yellow or white healing tissue from the base of the wound.  Cover the wound with fresh, clean, nonstick gauze and secure with paper tape. You may use Band-Aids in place of gauze and tape if the wound is small enough, but would recommend trimming much of the tape off as there is often too much. Sometimes Band-Aids can irritate the skin.  You should call the office for your biopsy report after 1 week if you have not already been contacted.  If you experience any problems, such as abnormal amounts of bleeding, swelling, significant bruising, significant pain, or evidence of infection, please call the office immediately.  FOR ADULT SURGERY PATIENTS: If you need something for pain relief you may take 1 extra strength Tylenol  (acetaminophen ) AND 2 Ibuprofen  (200mg  each) together every 4 hours as needed for pain. (do not take these if you are allergic to them or if you have a reason you should not take them.) Typically, you may only need pain medication for 1 to 3 days.     Gentle Skin Care Guide  1. Bathe no more than once a day.  2. Avoid bathing in hot water  3. Use a mild soap like Dove, Vanicream, Cetaphil, CeraVe. Can use Lever 2000 or Cetaphil antibacterial soap  4. Use soap only where you need it. On most days, use it under your arms, between your legs, and on your feet. Let the water rinse other areas unless visibly dirty.  5. When you get out of the bath/shower, use a towel to gently blot your skin dry, don't rub it.  6. While your skin is still a little damp, apply a moisturizing cream such as Vanicream, CeraVe, Cetaphil, Eucerin, Sarna lotion or plain Vaseline  Jelly. For hands apply Neutrogena [place] Hand Cream or Excipial Hand Cream.  7. Reapply moisturizer any time you start to itch or feel dry.  8. Sometimes using free and clear laundry detergents can be helpful. Fabric softener sheets should be avoided. Downy Free & Gentle liquid, or any liquid fabric softener that is free of dyes and perfumes, it acceptable to use  9. If your doctor has given you prescription creams you may apply moisturizers over them     Due to recent changes in healthcare laws, you may see results of your pathology and/or laboratory studies on MyChart before the doctors have had a chance to review them. We understand that in some cases there may be results that are confusing or concerning to you. Please understand that not all results are received at the same time and often the doctors may need to interpret multiple results in order to provide you with the best plan of care or course of treatment. Therefore, we ask that you please give us  2 business days to thoroughly review all your results before contacting the office for clarification. Should we see a critical lab result, you will be contacted sooner.   If You Need Anything After Your Visit  If you have any questions or concerns for your doctor, please call our main line at [phone] and press option 4 to reach your doctor's medical assistant. If no one answers,  please leave a voicemail as directed and we will return your call as soon as possible. Messages left after 4 pm will be answered the following business day.   You may also send us  a message via MyChart. We typically respond to MyChart messages within 1-2 business days.  For prescription refills, please ask your pharmacy to contact our office. Our fax number is [phone].  If you have an urgent issue when the clinic is closed that cannot wait until the next business day, you can page your doctor at the number below.    Please note that while we do our best  to be available for urgent issues outside of office hours, we are not available 24/7.   If you have an urgent issue and are unable to reach us , you may choose to seek medical care at your doctor's office, retail clinic, urgent care center, or emergency room.  If you have a medical emergency, please immediately call 911 or go to the emergency department.  Pager Numbers  - Dr. Hester: [phone]  - Dr. Jackquline: [phone]  - Dr. Claudene: [phone]   - Dr. Raymund: [phone]  In the event of inclement weather, please call our main line at [phone] for an update on the status of any delays or closures.  Dermatology Medication Tips: Please keep the boxes that topical medications come in in order to help keep track of the instructions about where and how to use these. Pharmacies typically print the medication instructions only on the boxes and not directly on the medication tubes.   If your medication is too expensive, please contact our office at [phone] option 4 or send us  a message through MyChart.   We are unable to tell what your co-pay for medications will be in advance as this is different depending on your insurance coverage. However, we may be able to find a substitute medication at lower cost or fill out paperwork to get insurance to cover a needed medication.   If a prior authorization is required to get your medication covered by your insurance company, please allow us  1-2 business days to complete this process.  Drug prices often vary depending on where the prescription is filled and some pharmacies may offer cheaper prices.  The website www.goodrx.com contains coupons for medications through different pharmacies. The prices here do not account for what the cost may be with help from insurance (it may be cheaper with your insurance), but the website can give you the price if you did not use any insurance.  - You can print the associated coupon and take it with  your prescription to the pharmacy.  - You may also stop by our office during regular business hours and pick up a GoodRx coupon card.  - If you need your prescription sent electronically to a different pharmacy, notify our office through [facility] or by phone at [phone] option 4.     Si Usted Necesita Algo Despus de Su Visita  Tambin puede enviarnos un mensaje a travs de Clinical cytogeneticist. Por lo general respondemos a los mensajes de MyChart en el transcurso de 1 a 2 das hbiles.  Para renovar recetas, por favor pida a su farmacia que se ponga en contacto con nuestra oficina. Randi lakes de fax es Chemult [phone].  Si tiene un asunto urgente cuando la clnica est cerrada y que no puede esperar hasta el siguiente da hbil, puede llamar/localizar a su doctor(a) al nmero que aparece a continuacin.   Por  favor, tenga en cuenta que aunque hacemos todo lo posible para estar disponibles para asuntos urgentes fuera del horario de [place], no estamos disponibles las 24 horas del da, los 7 [address]  [address] de la [place].   Si tiene un problema urgente y no puede comunicarse con nosotros, puede optar por buscar atencin mdica  en el consultorio de su doctor(a), en una clnica privada, en un centro de atencin urgente o en una sala de emergencias.  Si tiene Engineer, drilling, por favor llame inmediatamente al 911 o vaya a la sala de emergencias.  Nmeros de bper  - Dr. Hester: [phone]  - Dra. Jackquline: [phone]  - Dr. Claudene: [phone]  - Dra. Kitts: [phone]  En caso de inclemencias del [place], por favor llame a nuestra lnea principal al [phone] para una actualizacin sobre el estado de cualquier retraso o cierre.  Consejos para la medicacin en dermatologa: Por favor, guarde las cajas en las que vienen los medicamentos de uso tpico para ayudarle a seguir las instrucciones sobre dnde y cmo usarlos. Las farmacias generalmente imprimen las instrucciones del  medicamento slo en las cajas y no directamente en los tubos del [place].   Si su medicamento es muy caro, por favor, pngase en contacto con landry rieger llamando al [phone] y presione la opcin 4 o envenos un mensaje a travs de Clinical cytogeneticist.   No podemos decirle cul ser su copago por los medicamentos por adelantado ya que esto es diferente dependiendo de la cobertura de su seguro. Sin embargo, es posible que podamos encontrar un medicamento sustituto a Audiological scientist un formulario para que el seguro cubra el medicamento que se considera necesario.   Si se requiere una autorizacin previa para que su compaa de seguros malta su medicamento, por favor permtanos de 1 a 2 das hbiles para completar este proceso.  Los precios de los medicamentos varan con frecuencia dependiendo del Environmental consultant de dnde se surte la receta y alguna farmacias pueden ofrecer precios ms baratos.  El sitio web www.goodrx.com tiene cupones para medicamentos de Health and safety inspector. Los precios aqu no tienen en cuenta lo que podra costar con la ayuda del seguro (puede ser ms barato con su seguro), pero el sitio web puede darle el precio si no utiliz Tourist information centre manager.  - Puede imprimir el cupn correspondiente y llevarlo con su receta a la farmacia.  - Tambin puede pasar por nuestra oficina durante el horario de atencin regular y Education officer, museum una tarjeta de cupones de GoodRx.  - Si necesita que su receta se enve electrnicamente a una farmacia diferente, informe a nuestra oficina a travs de MyChart de [place] o por telfono llamando al [phone] y presione la opcin 4.

## 2024-03-17 LAB — SURGICAL PATHOLOGY

## 2024-03-20 ENCOUNTER — Ambulatory Visit: Payer: Self-pay | Admitting: Dermatology

## 2024-03-20 NOTE — Telephone Encounter (Signed)
-----   Message from Rozel sent at 03/20/2024  4:19 AM EDT ----- right lateral thigh :       EPIDERMAL HYPERPLASIA WITH FIBROSIS, SEE DESCRIPTION   Biopsy shows excoriated prurigo nodule consistent with chronic eczema. Please send tacrolimus ointment and make 1 month follow up. Will start Dupixent if still flaring, thank you ----- Message ----- From: Interface, Lab In [place] Sent: 03/17/2024   4:14 PM EDT To: Boneta Sharps, MD

## 2024-03-30 MED ORDER — TACROLIMUS 0.1 % EX OINT: TOPICAL_OINTMENT | Freq: Two times a day (BID) | CUTANEOUS | 0 refills | Status: AC

## 2024-03-30 NOTE — Addendum Note (Signed)
 Addended by: WILLMA KNEE A on: 03/30/2024 05:14 PM   Modules accepted: Orders

## 2024-03-30 NOTE — Telephone Encounter (Signed)
 Discussed pathology results with patient, medication sent to pharmacy, appointment scheduled.

## 2024-03-30 NOTE — Telephone Encounter (Signed)
-----   Message from Rozel sent at 03/20/2024  4:19 AM EDT ----- right lateral thigh :       EPIDERMAL HYPERPLASIA WITH FIBROSIS, SEE DESCRIPTION   Biopsy shows excoriated prurigo nodule consistent with chronic eczema. Please send tacrolimus ointment and make 1 month follow up. Will start Dupixent if still flaring, thank you ----- Message ----- From: Interface, Lab In [place] Sent: 03/17/2024   4:14 PM EDT To: Boneta Sharps, MD

## 2024-05-09 ENCOUNTER — Ambulatory Visit: Admitting: Physician Assistant

## 2024-05-09 ENCOUNTER — Encounter: Payer: Self-pay | Admitting: Physician Assistant

## 2024-05-09 VITALS — BP 122/80 | HR 97 | Ht 72.0 in | Wt 260.0 lb

## 2024-05-09 DIAGNOSIS — Z125 Encounter for screening for malignant neoplasm of prostate: Secondary | ICD-10-CM | POA: Diagnosis not present

## 2024-05-09 DIAGNOSIS — E782 Mixed hyperlipidemia: Secondary | ICD-10-CM | POA: Diagnosis not present

## 2024-05-09 DIAGNOSIS — K219 Gastro-esophageal reflux disease without esophagitis: Secondary | ICD-10-CM | POA: Insufficient documentation

## 2024-05-09 DIAGNOSIS — Z0000 Encounter for general adult medical examination without abnormal findings: Secondary | ICD-10-CM | POA: Diagnosis not present

## 2024-05-09 DIAGNOSIS — E1169 Type 2 diabetes mellitus with other specified complication: Secondary | ICD-10-CM

## 2024-05-09 DIAGNOSIS — J342 Deviated nasal septum: Secondary | ICD-10-CM | POA: Insufficient documentation

## 2024-05-09 DIAGNOSIS — H6123 Impacted cerumen, bilateral: Secondary | ICD-10-CM

## 2024-05-09 DIAGNOSIS — Z23 Encounter for immunization: Secondary | ICD-10-CM

## 2024-05-09 LAB — POCT GLYCOSYLATED HEMOGLOBIN (HGB A1C): Hemoglobin A1C: 6.4 % — AB (ref 4.0–5.6)

## 2024-05-09 MED ORDER — OMEPRAZOLE 20 MG PO CPDR: 20.0000 mg | DELAYED_RELEASE_CAPSULE | Freq: Every day | ORAL | 3 refills | Status: DC

## 2024-05-09 NOTE — Progress Notes (Signed)
 Date:  05/09/2024   Name:  Erik Valdez   DOB:  [birth year]   MRN:  [id number]   Chief Complaint: Annual Exam and Gastroesophageal Reflux (Having problems with reflex recently )  HPI  Erik Valdez returns today for routine physical exam after establishing care with me 3 months ago.  He has diet-controlled diabetes with last A1c 6.6%.  Intentional weight loss of 10 pounds over the last 3 months.  Chronic internal hemorrhoids which flare up occasionally, presently irritated after golfing, primarily bothered by itch, partial response with Proctosol cream.  This is confirmed on colonoscopy from 2017 which was otherwise unremarkable.  He does not get much fiber in his diet.  Last Physical: 01/05/19 Last Dental Exam: 10y ago Last Eye Exam: 2y ago Last CRC screen: colonoscopy 01/16/16 normal Last PSA: 01/05/19 Immunizations Due: flu, Shingrix, Prevnar 20  Medication list has been reviewed and updated.  Current Meds  Medication Sig   amLODipine  (NORVASC ) 2.5 MG tablet TAKE 1 TABLET BY MOUTH DAILY   cetirizine  (ZYRTEC  ALLERGY) 10 MG tablet Take 1 tablet (10 mg total) by mouth daily for 14 days.   clobetasol  cream (TEMOVATE ) 0.05 % Apply 1 Application topically 2 (two) times daily. Apply to rash until smooth then stop. Avoid face armpits groin   hydrocortisone  (PROCTOSOL HC) 2.5 % rectal cream Place rectally 2 (two) times daily.   hydrOXYzine  (VISTARIL ) 25 MG capsule Take 1 capsule (25 mg total) by mouth every 8 (eight) hours as needed.   losartan  (COZAAR ) 100 MG tablet Take 1 tablet (100 mg total) by mouth daily.   omeprazole (PRILOSEC) 20 MG capsule Take 1 capsule (20 mg total) by mouth daily.   rosuvastatin  (CRESTOR ) 5 MG tablet Take 1 tablet (5 mg total) by mouth daily.   sertraline  (ZOLOFT ) 50 MG tablet Take 1 tablet (50 mg total) by mouth daily.   tacrolimus (PROTOPIC) 0.1 % ointment Apply topically 2 (two) times daily.   triamcinolone  cream (KENALOG ) 0.1 % Apply 1 Application topically 2  (two) times daily.     Review of Systems  Patient Active Problem List   Diagnosis Date Noted   Gastroesophageal reflux disease 05/09/2024   Deviated nasal septum 05/09/2024   Diet-controlled Type 2 diabetes mellitus with other specified complication (HCC) 01/21/2024   Internal hemorrhoids 01/21/2024   Reactive depression 07/13/2019   Anxiety 07/13/2019   Mixed hyperlipidemia 09/28/2016   Chronic seasonal allergic rhinitis due to pollen 09/28/2016   Laboratory animal allergy 10/26/2014   Essential hypertension 10/26/2014   Class 2 severe obesity with serious comorbidity in adult 10/26/2014    No Known Allergies  Immunization History  Administered Date(s) Administered   PFIZER(Purple Top)SARS-COV-2 Vaccination 12/29/2019, 01/19/2020   PNEUMOCOCCAL CONJUGATE-20 05/09/2024   Pneumococcal Polysaccharide-23 10/17/2012   Td 06/22/1998   Tdap 10/17/2012    Past Surgical History:  Procedure Laterality Date   COLONOSCOPY WITH PROPOFOL  N/A 01/16/2016   Procedure: COLONOSCOPY WITH PROPOFOL ;  Surgeon: Rogelia Copping, MD;  Location: [facility];  Service: Endoscopy;  Laterality: N/A;   NASAL SINUS SURGERY     age 66 or 67    Social History   Tobacco Use   Smoking status: Never   Smokeless tobacco: Former    Types: Chew    Quit date: 06/10/2018   Tobacco comments:    Smokeless tobacco - occasionally (golfing, etc)  Vaping Use   Vaping status: Never Used  Substance Use Topics   Alcohol use: Yes    Alcohol/week: 6.0  standard drinks of alcohol    Types: 6 Cans of beer per week    Comment: socially   Drug use: Never    Family History  Problem Relation Age of Onset   Hypertension Mother    Hypertension Sister    Hypertension Maternal Grandfather         02/12/2023    8:05 AM 09/22/2022    8:22 AM 08/03/2022   11:16 AM 03/13/2022    8:29 AM  GAD 7 : Generalized Anxiety Score  Nervous, Anxious, on Edge 0 0 0 0  Control/stop worrying 0 0 0 0  Worry too much -  different things 0 0 0 0  Trouble relaxing 0 0 0 0  Restless 0 0 0 0  Easily annoyed or irritable 0 0 0 0  Afraid - awful might happen 0 0 0 0  Total GAD 7 Score 0 0 0 0  Anxiety Difficulty Not difficult at all Not difficult at all Not difficult at all Not difficult at all       02/12/2023    8:05 AM 09/22/2022    8:22 AM 08/03/2022   11:16 AM  Depression screen PHQ 2/9  Decreased Interest 0 0 0  Down, Depressed, Hopeless 0 0 0  PHQ - 2 Score 0 0 0  Altered sleeping 0 0 0  Tired, decreased energy 0 0 0  Change in appetite 0 0 0  Feeling bad or failure about yourself  0 0 0  Trouble concentrating 0 0 0  Moving slowly or fidgety/restless 0 0 0  Suicidal thoughts 0 0 0  PHQ-9 Score 0  0  0   Difficult doing work/chores Not difficult at all Not difficult at all Not difficult at all     Data saved with a previous flowsheet row definition    BP Readings from Last 3 Encounters:  05/09/24 122/80  02/02/24 106/76  01/21/24 124/88    Wt Readings from Last 3 Encounters:  05/09/24 260 lb (117.9 kg)  01/21/24 270 lb (122.5 kg)  02/12/23 276 lb (125.2 kg)    BP 122/80   Pulse 97   Ht 6' (1.829 m)   Wt 260 lb (117.9 kg)   SpO2 98%   BMI 35.26 kg/m   Physical Exam Vitals and nursing note reviewed.  Constitutional:      Appearance: Normal appearance.  HENT:     Right Ear: There is impacted cerumen.     Left Ear: There is impacted cerumen.     Ears:     Comments: EAC clear bilaterally with good view of TM which is without effusion or erythema.     Nose: Septal deviation (mod-sev S shaped septal deviation) and congestion present.     Mouth/Throat:     Mouth: Mucous membranes are moist. No oral lesions.     Dentition: Abnormal dentition (missing some teeth, discoloration, likely caries).     Pharynx: No posterior oropharyngeal erythema.  Eyes:     Extraocular Movements: Extraocular movements intact.     Conjunctiva/sclera: Conjunctivae normal.     Pupils: Pupils are equal,  round, and reactive to light.  Neck:     Thyroid: No thyromegaly.  Cardiovascular:     Rate and Rhythm: Normal rate and regular rhythm.     Heart sounds: No murmur heard.    No friction rub. No gallop.     Comments: Pulses 2+ at radial, PT, DP bilaterally. No carotid bruit. No peripheral edema Pulmonary:  Effort: Pulmonary effort is normal.     Breath sounds: Normal breath sounds.  Abdominal:     General: Bowel sounds are normal.     Palpations: Abdomen is soft. There is no mass.     Tenderness: There is no abdominal tenderness.  Genitourinary:    Comments: Prostate exam deferred due to active hemorrhoids Musculoskeletal:     Comments: Full ROM with strength 5/5 bilateral upper and lower extremities  Lymphadenopathy:     Cervical: No cervical adenopathy.  Skin:    General: Skin is warm.     Capillary Refill: Capillary refill takes less than 2 seconds.     Findings: No lesion or rash.  Neurological:     Mental Status: He is alert and oriented to person, place, and time.     Gait: Gait is intact.  Psychiatric:        Mood and Affect: Mood normal.        Behavior: Behavior normal.    Diabetic Foot Exam - Simple   Simple Foot Form Diabetic Foot exam was performed with the following findings: Yes 05/09/2024  8:20 AM  Visual Inspection No deformities, no ulcerations, no other skin breakdown bilaterally: Yes Sensation Testing Intact to touch and monofilament testing bilaterally: Yes Pulse Check Posterior Tibialis and Dorsalis pulse intact bilaterally: Yes Comments      Recent Labs     Component Value Date/Time   NA 137 09/22/2022 0900   K 5.3 (H) 09/22/2022 0900   CL 101 09/22/2022 0900   CO2 21 09/22/2022 0900   GLUCOSE 140 (H) 09/22/2022 0900   BUN 15 09/22/2022 0900   CREATININE 0.91 09/22/2022 0900   CALCIUM  9.6 09/22/2022 0900   PROT 7.0 09/22/2022 0900   ALBUMIN 4.4 09/22/2022 0900   AST 18 09/22/2022 0900   ALT 24 09/22/2022 0900   ALKPHOS 110  09/22/2022 0900   BILITOT 0.5 09/22/2022 0900   GFRNONAA 92 03/06/2020 0902   GFRAA 106 03/06/2020 0902    Lab Results  Component Value Date   WBC 7.7 01/09/2021   HGB 15.5 01/09/2021   HCT 45.1 01/09/2021   MCV 91.1 01/09/2021   PLT 641 (H) 01/09/2021   Lab Results  Component Value Date   HGBA1C 6.4 (A) 05/09/2024   HGBA1C 6.6% 01/21/2024   HGBA1C 6.6 (H) 02/12/2023   Lab Results  Component Value Date   CHOL 174 09/22/2022   HDL 42 09/22/2022   LDLCALC 109 (H) 09/22/2022   TRIG 131 09/22/2022   CHOLHDL 5.1 (H) 01/05/2019   No results found for: TSH    Assessment and Plan:  1. Annual physical exam (Primary) Encouraged healthy lifestyle including regular physical activity and consumption of whole fruits and vegetables. Encouraged routine dental and eye exams.   Check fasting lab work today  - CBC with Differential/Platelet - Comprehensive metabolic panel with GFR - TSH - Lipid panel - Microalbumin / creatinine urine ratio  2.  Diet controlled type 2 diabetes mellitus with other specified complication, without long-term current use of insulin (HCC) A1c 6.4% today reflecting ongoing glycemic control without medication.  Congratulated on this.  - Comprehensive metabolic panel with GFR - POCT glycosylated hemoglobin (Hb A1C) - Lipid panel - Microalbumin / creatinine urine ratio  3. Mixed hyperlipidemia Check fasting lipids - Lipid panel  4. Screening PSA (prostate specific antigen) - PSA  5. Gastroesophageal reflux disease, unspecified whether esophagitis present Begin omeprazole for acid reflux flare.  Patient advised that this would preferably  be used for short-term - omeprazole (PRILOSEC) 20 MG capsule; Take 1 capsule (20 mg total) by mouth daily.  Dispense: 30 capsule; Refill: 3  6. Deviated nasal septum Discussed patient's septum is deviated in such a way that it occludes both nasal passages.  Endorses prior trauma.  Offered ENT referral for surgical  consult but he defers at this time; will discuss with wife.  7. Encounter for immunization Discussed immunizations due.  Prevnar 20 administered today.  He would also be interested in Shingrix, but would prefer to return at a later date which I think is reasonable - Pneumococcal conjugate vaccine 20-valent  8. Bilateral impacted cerumen Discussed bilateral cerumen impaction and offered irrigation, but patient has to go to work and would like to defer at this time.  May arrange for nurse visit if desired.   Follow-up in 3 months OV DM2 Return in about 1 year (around 05/09/2025) for CPE.    Rolan Hoyle, PA-C, DMSc, Nutritionist [facility] [phone]

## 2024-05-10 ENCOUNTER — Ambulatory Visit: Payer: Self-pay | Admitting: Physician Assistant

## 2024-05-10 LAB — LIPID PANEL
Chol/HDL Ratio: 3.6 ratio (ref 0.0–5.0)
Cholesterol, Total: 139 mg/dL (ref 100–199)
HDL: 39 mg/dL — ABNORMAL LOW (ref >39)
LDL Chol Calc (NIH): 76 mg/dL (ref 0–99)
Triglycerides: 133 mg/dL (ref 0–149)
VLDL Cholesterol Cal: 24 mg/dL (ref 5–40)

## 2024-05-10 LAB — COMPREHENSIVE METABOLIC PANEL WITH GFR
ALT: 19 IU/L (ref 0–44)
AST: 16 IU/L (ref 0–40)
Albumin: 4 g/dL (ref 3.8–4.9)
Alkaline Phosphatase: 120 IU/L (ref 47–123)
BUN/Creatinine Ratio: 13 (ref 9–20)
BUN: 10 mg/dL (ref 6–24)
Bilirubin Total: 0.3 mg/dL (ref 0.0–1.2)
CO2: 20 mmol/L (ref 20–29)
Calcium: 9.3 mg/dL (ref 8.7–10.2)
Chloride: 103 mmol/L (ref 96–106)
Creatinine, Ser: 0.8 mg/dL (ref 0.76–1.27)
Globulin, Total: 2.4 g/dL (ref 1.5–4.5)
Glucose: 134 mg/dL — ABNORMAL HIGH (ref 70–99)
Potassium: 4.6 mmol/L (ref 3.5–5.2)
Sodium: 136 mmol/L (ref 134–144)
Total Protein: 6.4 g/dL (ref 6.0–8.5)
eGFR: 104 mL/min/1.73 (ref >59)

## 2024-05-10 LAB — TSH: TSH: 2.15 u[IU]/mL (ref 0.450–4.500)

## 2024-05-10 LAB — CBC WITH DIFFERENTIAL/PLATELET
Basophils Absolute: 0 x10E3/uL (ref 0.0–0.2)
Basos: 1 %
EOS (ABSOLUTE): 0.3 x10E3/uL (ref 0.0–0.4)
Eos: 5 %
Hematocrit: 41.7 % (ref 37.5–51.0)
Hemoglobin: 13.3 g/dL (ref 13.0–17.7)
Immature Grans (Abs): 0 x10E3/uL (ref 0.0–0.1)
Immature Granulocytes: 0 %
Lymphocytes Absolute: 1.8 x10E3/uL (ref 0.7–3.1)
Lymphs: 27 %
MCH: 28.5 pg (ref 26.6–33.0)
MCHC: 31.9 g/dL (ref 31.5–35.7)
MCV: 89 fL (ref 79–97)
Monocytes Absolute: 0.5 x10E3/uL (ref 0.1–0.9)
Monocytes: 8 %
Neutrophils Absolute: 4 x10E3/uL (ref 1.4–7.0)
Neutrophils: 59 %
Platelets: 374 x10E3/uL (ref 150–450)
RBC: 4.67 x10E6/uL (ref 4.14–5.80)
RDW: 13.1 % (ref 11.6–15.4)
WBC: 6.7 x10E3/uL (ref 3.4–10.8)

## 2024-05-10 LAB — PSA: Prostate Specific Ag, Serum: 0.4 ng/mL (ref 0.0–4.0)

## 2024-05-10 LAB — MICROALBUMIN / CREATININE URINE RATIO
Creatinine, Urine: 243.8 mg/dL
Microalb/Creat Ratio: 4 mg/g{creat} (ref 0–29)
Microalbumin, Urine: 8.7 ug/mL

## 2024-05-25 ENCOUNTER — Ambulatory Visit: Admitting: Dermatology

## 2024-06-29 ENCOUNTER — Encounter: Payer: Self-pay | Admitting: Physician Assistant

## 2024-06-29 ENCOUNTER — Ambulatory Visit: Admitting: Physician Assistant

## 2024-06-29 VITALS — BP 110/86 | HR 100 | Temp 98.6°F | Ht 72.0 in | Wt 254.0 lb

## 2024-06-29 DIAGNOSIS — K219 Gastro-esophageal reflux disease without esophagitis: Secondary | ICD-10-CM

## 2024-06-29 MED ORDER — OMEPRAZOLE 40 MG PO CPDR: 40.0000 mg | DELAYED_RELEASE_CAPSULE | Freq: Every day | ORAL | 1 refills | Status: AC

## 2024-06-29 NOTE — Progress Notes (Signed)
 "   Date:  06/29/2024   Name:  Erik Valdez   DOB:  02-11-1966   MRN:  [id number]   Chief Complaint: Gastroesophageal Reflux (Getting worse, carbonated drinks make it worse)  HPI  Erik Valdez presents today with worsening GERD symptoms, especially with carbonated beverages.  States this causes pretty significant abdominal pain which seems to radiate up the left side of her chest.  No exertional chest pain or dyspnea.  He was originally taking omeprazole  as needed which was helpful, but as of about a week ago he began taking it once per day.  Since he began the once daily regimen, he has only noted 1 instance of symptoms.  He is considering dose increase temporarily.  He continues to work on positive lifestyle changes and has lost an additional 6 pounds since our last visit 2 months ago.  Medication list has been reviewed and updated.  Active Medications[1]   Review of Systems  Patient Active Problem List   Diagnosis Date Noted   Gastroesophageal reflux disease 05/09/2024   Deviated nasal septum 05/09/2024   Diet-controlled Type 2 diabetes mellitus with other specified complication (HCC) 01/21/2024   Internal hemorrhoids 01/21/2024   Reactive depression 07/13/2019   Anxiety 07/13/2019   Mixed hyperlipidemia 09/28/2016   Chronic seasonal allergic rhinitis due to pollen 09/28/2016   Laboratory animal allergy 10/26/2014   Essential hypertension 10/26/2014   Class 2 severe obesity with serious comorbidity in adult 10/26/2014    Allergies[2]  Immunization History  Administered Date(s) Administered   PFIZER(Purple Top)SARS-COV-2 Vaccination 12/29/2019, 01/19/2020   PNEUMOCOCCAL CONJUGATE-20 05/09/2024   Pneumococcal Polysaccharide-23 10/17/2012   Td 06/22/1998   Tdap 10/17/2012    Past Surgical History:  Procedure Laterality Date   COLONOSCOPY WITH PROPOFOL  N/A 01/16/2016   Procedure: COLONOSCOPY WITH PROPOFOL ;  Surgeon: Rogelia Copping, MD;  Location: [facility];  Service:  Endoscopy;  Laterality: N/A;   NASAL SINUS SURGERY     age 10 or 32    Social History[3]  Family History  Problem Relation Age of Onset   Hypertension Mother    Hypertension Sister    Hypertension Maternal Grandfather         06/29/2024   10:53 AM 02/12/2023    8:05 AM 09/22/2022    8:22 AM 08/03/2022   11:16 AM  GAD 7 : Generalized Anxiety Score  Nervous, Anxious, on Edge 0 0 0 0  Control/stop worrying 0 0 0 0  Worry too much - different things 0 0 0 0  Trouble relaxing 0 0 0 0  Restless 0 0 0 0  Easily annoyed or irritable 0 0 0 0  Afraid - awful might happen 0 0 0 0  Total GAD 7 Score 0 0 0 0  Anxiety Difficulty Not difficult at all Not difficult at all Not difficult at all Not difficult at all       06/29/2024   10:52 AM 02/12/2023    8:05 AM 09/22/2022    8:22 AM  Depression screen PHQ 2/9  Decreased Interest 0 0 0  Down, Depressed, Hopeless 0 0 0  PHQ - 2 Score 0 0 0  Altered sleeping 0 0 0  Tired, decreased energy 0 0 0  Change in appetite 1 0 0  Feeling bad or failure about yourself  0 0 0  Trouble concentrating 0 0 0  Moving slowly or fidgety/restless 0 0 0  Suicidal thoughts 0 0 0  PHQ-9 Score 1 0  0  Difficult doing work/chores Not difficult at all Not difficult at all Not difficult at all     Data saved with a previous flowsheet row definition    BP Readings from Last 3 Encounters:  06/29/24 110/86  05/09/24 122/80  02/02/24 106/76    Wt Readings from Last 3 Encounters:  06/29/24 254 lb (115.2 kg)  05/09/24 260 lb (117.9 kg)  01/21/24 270 lb (122.5 kg)    BP 110/86   Pulse 100   Temp 98.6 F (37 C)   Ht 6' (1.829 m)   Wt 254 lb (115.2 kg)   SpO2 97%   BMI 34.45 kg/m   Physical Exam Vitals and nursing note reviewed.  Constitutional:      Appearance: Normal appearance.  Cardiovascular:     Rate and Rhythm: Normal rate.  Pulmonary:     Effort: Pulmonary effort is normal.  Abdominal:     General: There is no distension.   Musculoskeletal:        General: Normal range of motion.  Skin:    General: Skin is warm and dry.  Neurological:     Mental Status: He is alert and oriented to person, place, and time.     Gait: Gait is intact.  Psychiatric:        Mood and Affect: Mood and affect normal.     Recent Labs     Component Value Date/Time   NA 136 05/09/2024 0853   K 4.6 05/09/2024 0853   CL 103 05/09/2024 0853   CO2 20 05/09/2024 0853   GLUCOSE 134 (H) 05/09/2024 0853   BUN 10 05/09/2024 0853   CREATININE 0.80 05/09/2024 0853   CALCIUM  9.3 05/09/2024 0853   PROT 6.4 05/09/2024 0853   ALBUMIN 4.0 05/09/2024 0853   AST 16 05/09/2024 0853   ALT 19 05/09/2024 0853   ALKPHOS 120 05/09/2024 0853   BILITOT 0.3 05/09/2024 0853   GFRNONAA 92 03/06/2020 0902   GFRAA 106 03/06/2020 0902    Lab Results  Component Value Date   WBC 6.7 05/09/2024   HGB 13.3 05/09/2024   HCT 41.7 05/09/2024   MCV 89 05/09/2024   PLT 374 05/09/2024   Lab Results  Component Value Date   HGBA1C 6.4 (A) 05/09/2024   HGBA1C 6.6% 01/21/2024   HGBA1C 6.6 (H) 02/12/2023   Lab Results  Component Value Date   CHOL 139 05/09/2024   HDL 39 (L) 05/09/2024   LDLCALC 76 05/09/2024   TRIG 133 05/09/2024   CHOLHDL 3.6 05/09/2024   Lab Results  Component Value Date   TSH 2.150 05/09/2024      Assessment and Plan:  Gastroesophageal reflux disease, unspecified whether esophagitis present Assessment & Plan: Increase omeprazole  to 40 mg daily, but only for the next month or 2 then we will decrease back to 20 mg daily.  Patient in agreement with plan.  Patient education printed regarding GERD.  Orders: -     Omeprazole ; Take 1 capsule (40 mg total) by mouth daily.  Dispense: 30 capsule; Refill: 1     Follow-up as scheduled in about 6 weeks   Rolan Hoyle, PA-C, DMSc, DipACLM, Nutritionist [facility] [phone]      [1]   Current Meds  Medication Sig   amLODipine  (NORVASC ) 2.5 MG tablet TAKE 1 TABLET BY MOUTH DAILY   clobetasol  cream (TEMOVATE ) 0.05 % Apply 1 Application topically 2 (two) times daily. Apply to  rash until smooth then stop. Avoid face armpits groin   hydrocortisone  (PROCTOSOL HC) 2.5 % rectal cream Place rectally 2 (two) times daily.   hydrOXYzine  (VISTARIL ) 25 MG capsule Take 1 capsule (25 mg total) by mouth every 8 (eight) hours as needed.   loratadine  (CLARITIN ) 10 MG tablet Take 1 tablet (10 mg total) by mouth daily.   losartan  (COZAAR ) 100 MG tablet Take 1 tablet (100 mg total) by mouth daily.   rosuvastatin  (CRESTOR ) 5 MG tablet Take 1 tablet (5 mg total) by mouth daily.   tacrolimus  (PROTOPIC ) 0.1 % ointment Apply topically 2 (two) times daily.   triamcinolone  cream (KENALOG ) 0.1 % Apply 1 Application topically 2 (two) times daily.   [DISCONTINUED] omeprazole  (PRILOSEC) 20 MG capsule Take 1 capsule (20 mg total) by mouth daily.   [DISCONTINUED] sertraline  (ZOLOFT ) 50 MG tablet Take 1 tablet (50 mg total) by mouth daily.  [2] No Known Allergies [3]  Social History Tobacco Use   Smoking status: Never   Smokeless tobacco: Former    Types: Chew    Quit date: 06/10/2018   Tobacco comments:    Smokeless tobacco - occasionally (golfing, etc)  Vaping Use   Vaping status: Never Used  Substance Use Topics   Alcohol use: Yes    Alcohol/week: 6.0 standard drinks of alcohol    Types: 6 Cans of beer per week    Comment: socially   Drug use: Never   "

## 2024-06-29 NOTE — Assessment & Plan Note (Signed)
 Increase omeprazole  to 40 mg daily, but only for the next month or 2 then we will decrease back to 20 mg daily.  Patient in agreement with plan.  Patient education printed regarding GERD.

## 2024-08-10 ENCOUNTER — Ambulatory Visit: Admitting: Physician Assistant

## 2025-05-10 ENCOUNTER — Encounter: Admitting: Physician Assistant
